# Patient Record
Sex: Male | Born: 1960 | Race: White | Hispanic: No | Marital: Married | Smoking: Never smoker
Health system: Southern US, Community
[De-identification: ages and names within clinical notes are randomized; demographics above are authoritative.]

## PROBLEM LIST (undated history)

## (undated) DIAGNOSIS — I10 Essential (primary) hypertension: Secondary | ICD-10-CM

## (undated) DIAGNOSIS — E785 Hyperlipidemia, unspecified: Secondary | ICD-10-CM

## (undated) DIAGNOSIS — K219 Gastro-esophageal reflux disease without esophagitis: Secondary | ICD-10-CM

## (undated) DIAGNOSIS — T7840XA Allergy, unspecified, initial encounter: Secondary | ICD-10-CM

## (undated) DIAGNOSIS — G4730 Sleep apnea, unspecified: Secondary | ICD-10-CM

## (undated) DIAGNOSIS — C801 Malignant (primary) neoplasm, unspecified: Secondary | ICD-10-CM

## (undated) HISTORY — DX: Hyperlipidemia, unspecified: E78.5

## (undated) HISTORY — PX: KNEE SURGERY: SHX244

## (undated) HISTORY — DX: Gastro-esophageal reflux disease without esophagitis: K21.9

## (undated) HISTORY — DX: Allergy, unspecified, initial encounter: T78.40XA

## (undated) HISTORY — DX: Sleep apnea, unspecified: G47.30

## (undated) HISTORY — DX: Essential (primary) hypertension: I10

---

## 2016-12-08 ENCOUNTER — Ambulatory Visit (INDEPENDENT_AMBULATORY_CARE_PROVIDER_SITE_OTHER): Payer: 59 | Admitting: Family Medicine

## 2016-12-08 ENCOUNTER — Encounter: Payer: Self-pay | Admitting: Family Medicine

## 2016-12-08 ENCOUNTER — Ambulatory Visit (INDEPENDENT_AMBULATORY_CARE_PROVIDER_SITE_OTHER)
Admission: RE | Admit: 2016-12-08 | Discharge: 2016-12-08 | Disposition: A | Discharge status: Home / Self Care | Payer: 59 | Source: Ambulatory Visit | Attending: Family Medicine | Admitting: Family Medicine

## 2016-12-08 VITALS — BP 134/86 | HR 73 | Temp 99.0°F | Ht 74.25 in | Wt 230.0 lb

## 2016-12-08 DIAGNOSIS — G8929 Other chronic pain: Secondary | ICD-10-CM | POA: Insufficient documentation

## 2016-12-08 DIAGNOSIS — M25562 Pain in left knee: Secondary | ICD-10-CM

## 2016-12-08 MED ORDER — MELOXICAM 15 MG PO TABS: 15.0000 mg | ORAL_TABLET | Freq: Every day | ORAL | 1 refills | Status: DC

## 2016-12-08 NOTE — Progress Notes (Signed)
  Sean [name] - 56 y.o. male MRN [id number]  Date of birth: 20-Nov-1960  SUBJECTIVE:  Including CC & ROS.  Chief Complaint  Patient presents with  . Knee Pain    left--fell down stairs tuesday and bend knee very far with causing great pain--knee has hurt for years.   Sean [name] is a 56 year old male is presenting with left knee pain. He reports having a fall last Tuesday and had significant pain. Over the course of the past few years he has had a decrease in his range of motion. He has a history of a meniscal tear in the 90s and had an arthroscopic surgery. Today he does not feel pain but is much as a stiffness. He is not taking any medications for this. He denies any locking way. He denies any brace use or use of a compression sleeve.     Review of Systems  Musculoskeletal: Positive for joint swelling. Negative for arthralgias and myalgias.  Neurological: Negative for weakness and numbness.   otherwise negative  HISTORY: Past Medical, Surgical, Social, and Family History Reviewed & Updated per EMR.   Pertinent Historical Findings include:  Past Medical History:  Diagnosis Date  . Hyperlipidemia   . Hypertension     Past Surgical History:  Procedure Laterality Date  . KNEE SURGERY      No Known Allergies  Family History  Problem Relation Age of Onset  . Prostate cancer Father      Social History   Social History  . Marital status: Married    Spouse name: N/A  . Number of children: N/A  . Years of education: N/A   Occupational History  . Not on file.   Social History Main Topics  . Smoking status: Never Smoker  . Smokeless tobacco: Never Used  . Alcohol use No  . Drug use: No  . Sexual activity: Not on file   Other Topics Concern  . Not on file   Social History Narrative  . No narrative on file     PHYSICAL EXAM:  VS: BP 134/86 (BP Location: Left Arm, Patient Position: Sitting, Cuff Size: Normal)   Pulse 73   Temp 99 F (37.2 C) (Oral)   Ht 6' 2.25"  (1.886 m)   Wt 230 lb (104.3 kg)   SpO2 99%   BMI 29.33 kg/m  Physical Exam Gen: NAD, alert, cooperative with exam, well-appearing ENT: normal lips, normal nasal mucosa,  Eye: PERRL, normal conjunctiva and lids CV:  no edema, +2 pedal pulses   Resp: no accessory muscle use, non-labored,   Skin: no rashes, no areas of induration  Neuro: Normal tone, normal sensation to touch Psych:  normal insight, alert and oriented MSK:  Left knee:  Significant effusion. No tenderness to palpation over the quadrant patellar tendon. No significant tenderness to palpation over the medial lateral joint line. Limited extension to about 5. Limited flexion to 110. Negative McMurray's test. Neurovascularly intact   ASSESSMENT & PLAN:   Chronic pain of left knee He has a significant effusion on ultrasound and findings are suggestive of arthritis. - Mobic sent - Advised ice and compressive therapy - If no improvement in follow-up in 2 weeks and can consider an aspiration and injection.

## 2016-12-08 NOTE — Patient Instructions (Signed)
Thank you for coming in,   Please try taking mobic on a daily basis for 10 days and then as needed after that. Please try wearing a compression sleeve when you're going to be walking around. You can obtain a compression sleeve from the [email or web address]. Please follow up with me if your symptoms worsen. We could try physical therapy in the future as well.    Please feel free to call with any questions or concerns at any time, at [phone]. --Dr. Raeford Razor

## 2016-12-08 NOTE — Assessment & Plan Note (Signed)
He has a significant effusion on ultrasound and findings are suggestive of arthritis. - Mobic sent - Advised ice and compressive therapy - If no improvement in follow-up in 2 weeks and can consider an aspiration and injection.

## 2016-12-25 ENCOUNTER — Ambulatory Visit (INDEPENDENT_AMBULATORY_CARE_PROVIDER_SITE_OTHER): Payer: 59 | Admitting: Family Medicine

## 2016-12-25 ENCOUNTER — Encounter: Payer: Self-pay | Admitting: Family Medicine

## 2016-12-25 VITALS — BP 135/90 | HR 80 | Ht 74.0 in | Wt 230.0 lb

## 2016-12-25 DIAGNOSIS — I10 Essential (primary) hypertension: Secondary | ICD-10-CM | POA: Diagnosis not present

## 2016-12-25 DIAGNOSIS — Z0000 Encounter for general adult medical examination without abnormal findings: Secondary | ICD-10-CM

## 2016-12-25 DIAGNOSIS — E785 Hyperlipidemia, unspecified: Secondary | ICD-10-CM

## 2016-12-25 DIAGNOSIS — D171 Benign lipomatous neoplasm of skin and subcutaneous tissue of trunk: Secondary | ICD-10-CM | POA: Diagnosis not present

## 2016-12-25 DIAGNOSIS — Z114 Encounter for screening for human immunodeficiency virus [HIV]: Secondary | ICD-10-CM

## 2016-12-25 DIAGNOSIS — Z1159 Encounter for screening for other viral diseases: Secondary | ICD-10-CM

## 2016-12-25 DIAGNOSIS — D179 Benign lipomatous neoplasm, unspecified: Secondary | ICD-10-CM | POA: Insufficient documentation

## 2016-12-25 LAB — CBC WITH DIFFERENTIAL/PLATELET
Basophils Absolute: 0 10*3/uL (ref 0.0–0.1)
Basophils Relative: 0.7 % (ref 0.0–3.0)
EOS ABS: 0.1 10*3/uL (ref 0.0–0.7)
EOS PCT: 1.2 % (ref 0.0–5.0)
HCT: 42 % (ref 39.0–52.0)
HEMOGLOBIN: 14 g/dL (ref 13.0–17.0)
LYMPHS ABS: 1.8 10*3/uL (ref 0.7–4.0)
Lymphocytes Relative: 30.1 % (ref 12.0–46.0)
MCHC: 33.4 g/dL (ref 30.0–36.0)
MCV: 89.9 fl (ref 78.0–100.0)
MONO ABS: 0.4 10*3/uL (ref 0.1–1.0)
Monocytes Relative: 6.6 % (ref 3.0–12.0)
NEUTROS PCT: 61.4 % (ref 43.0–77.0)
Neutro Abs: 3.6 10*3/uL (ref 1.4–7.7)
Platelets: 274 10*3/uL (ref 150.0–400.0)
RBC: 4.67 Mil/uL (ref 4.22–5.81)
RDW: 13.9 % (ref 11.5–15.5)
WBC: 5.8 10*3/uL (ref 4.0–10.5)

## 2016-12-25 LAB — COMPREHENSIVE METABOLIC PANEL
ALBUMIN: 3.9 g/dL (ref 3.5–5.2)
ALT: 17 U/L (ref 0–53)
AST: 13 U/L (ref 0–37)
Alkaline Phosphatase: 43 U/L (ref 39–117)
BUN: 18 mg/dL (ref 6–23)
CHLORIDE: 107 meq/L (ref 96–112)
CO2: 26 mEq/L (ref 19–32)
CREATININE: 0.8 mg/dL (ref 0.40–1.50)
Calcium: 9.2 mg/dL (ref 8.4–10.5)
GFR: 106.15 mL/min (ref >60.00)
GLUCOSE: 130 mg/dL — AB (ref 70–99)
POTASSIUM: 3.7 meq/L (ref 3.5–5.1)
SODIUM: 141 meq/L (ref 135–145)
TOTAL PROTEIN: 6.1 g/dL (ref 6.0–8.3)
Total Bilirubin: 0.4 mg/dL (ref 0.2–1.2)

## 2016-12-25 LAB — LIPID PANEL
CHOL/HDL RATIO: 5
Cholesterol: 151 mg/dL (ref 0–200)
HDL: 33.3 mg/dL — ABNORMAL LOW (ref >39.00)
NONHDL: 118.19
Triglycerides: 294 mg/dL — ABNORMAL HIGH (ref 0.0–149.0)
VLDL: 58.8 mg/dL — ABNORMAL HIGH (ref 0.0–40.0)

## 2016-12-25 LAB — HEMOGLOBIN A1C: Hgb A1c MFr Bld: 5.9 % (ref 4.6–6.5)

## 2016-12-25 LAB — LDL CHOLESTEROL, DIRECT: LDL DIRECT: 95 mg/dL

## 2016-12-25 MED ORDER — ATORVASTATIN CALCIUM 20 MG PO TABS: 20.0000 mg | ORAL_TABLET | Freq: Every day | ORAL | 1 refills | Status: DC

## 2016-12-25 MED ORDER — LISINOPRIL 10 MG PO TABS: 10.0000 mg | ORAL_TABLET | Freq: Every day | ORAL | 1 refills | Status: DC

## 2016-12-25 NOTE — Assessment & Plan Note (Signed)
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 Most likely benign given that it has not changed in size or other characteristic in several years. Hernia is also possible, though less likely. Offered to order an ultrasound today for further evaluation, however patient deferred. Will continue with watchful waiting.

## 2016-12-25 NOTE — Assessment & Plan Note (Addendum)
At goal. Continue lisinopril. Check CMET today. Follow up 6 months. Encouraged home blood pressure monitoring.

## 2016-12-25 NOTE — Patient Instructions (Signed)
We will check blood work today.  I sent in refills for your lisinopril and atorvastatin.  Come back to see me in 6 months to 1 year.  If you change your mind about your ultrasound, please let me know.  Take care,  Dr Jerline Pain

## 2016-12-25 NOTE — Assessment & Plan Note (Signed)
Check lipid panel.  Continue atorvastatin. 

## 2016-12-25 NOTE — Progress Notes (Signed)
Subjective:  Sean [name] is a 56 y.o. male who presents today for his annual comprehensive physical exam and to establish care  HPI:  Hypertension, Chronic Problem, New problem to this provider BP Readings from Last 3 Encounters:  12/25/16 135/90  12/08/16 134/86   Home BP monitoring: No, Current Medications: lisinopril 10mg  daily, compliant without side effects.  ROS: Denies any chest pain, shortness of breath, dyspnea on exertion, leg edema.   Hyperlipidemia, Chronic, New problem to this provider On statin: Atorvastatin 20mg  daily Side Effects: None  ROS: No chest pain or shortness of breath. No myalgias.  Abdominal Mass Noticed it several years ago. Located on right side of abdomen. Very seldomly painful. No fevers. Has not changed in several years. Does not bother patient. No nausea or vomiting. No fevers or chills. No constipation or diarrhea.   Lifestyle Diet: Tries to eat healthy, though admits there is room for improvement.  Exercise: Does not exercise.   Depression screen PHQ 2/9 12/08/2016  Decreased Interest 0  Down, Depressed, Hopeless 0  PHQ - 2 Score 0   Health Maintenance Due  Topic Date Due  . Hepatitis C Screening  08-10-1960  . HIV Screening  08/19/1975  . TETANUS/TDAP  08/19/1979  . INFLUENZA VACCINE  12/09/2016    ROS: Per HPI, otherwise all systems reviewed and are negative  PMH:  The following were reviewed and entered/updated in epic: Past Medical History:  Diagnosis Date  . Hyperlipidemia   . Hypertension    Patient Active Problem List   Diagnosis Date Noted  . Hypertension 12/25/2016  . Hyperlipidemia 12/25/2016  . Lipoma 12/25/2016  . Chronic pain of left knee 12/08/2016   Past Surgical History:  Procedure Laterality Date  . KNEE SURGERY     Family History  Problem Relation Age of Onset  . Prostate cancer Father    Medications- reviewed and updated Current Outpatient Prescriptions  Medication Sig Dispense Refill  .  atorvastatin (LIPITOR) 20 MG tablet Take 1 tablet (20 mg total) by mouth daily. 90 tablet 1  . lisinopril (PRINIVIL,ZESTRIL) 10 MG tablet Take 1 tablet (10 mg total) by mouth daily. 90 tablet 1  . meloxicam (MOBIC) 15 MG tablet Take 1 tablet (15 mg total) by mouth daily. 30 tablet 1   No current facility-administered medications for this visit.     Allergies-reviewed and updated No Known Allergies  Social History   Social History  . Marital status: Married    Spouse name: N/A  . Number of children: 3  . Years of education: N/A   Social History Main Topics  . Smoking status: Never Smoker  . Smokeless tobacco: Never Used  . Alcohol use No  . Drug use: No  . Sexual activity: Not Asked   Other Topics Concern  . None   Social History Narrative  . None   Objective:  Physical Exam: BP 135/90   Pulse 80   Ht 6\' 2"  (1.88 m)   Wt 230 lb (104.3 kg)   SpO2 97%   BMI 29.53 kg/m   Body mass index is 29.53 kg/m. Gen: NAD, resting comfortably CV: RRR with no murmurs appreciated Pulm: NWOB, CTAB with no crackles, wheezes, or rhonchi GI: Obese Normal bowel sounds present. Soft, Nontender, Nondistended. Approxmiately 3-4cm freely mobile mass in outer abdominal wall. Nontender.  MSK: no edema, cyanosis, or clubbing noted Skin: warm, dry Neuro: grossly normal, moves all extremities Psych: Normal affect and thought content   Assessment/Plan:  Hypertension At  goal. Continue lisinopril. Check CMET today. Follow up 6 months. Encouraged home blood pressure monitoring.   Hyperlipidemia Check lipid panel. Continue atorvastatin.   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 Most likely benign given that it has not changed in size or other characteristic in several years. Hernia is also possible, though less likely. Offered to order an ultrasound today for further evaluation, however patient deferred. Will continue with watchful waiting.   Preventative Healthcare HIV and HCV ordered  today.   Patient Counseling:  -Nutrition: Stressed importance of moderation in sodium/caffeine intake, saturated fat and cholesterol, caloric balance, sufficient intake of fresh fruits, vegetables, and fiber.  -Stressed the importance of regular exercise.   -Substance Abuse: Discussed cessation/primary prevention of tobacco, alcohol, or other drug use; driving or other dangerous activities under the influence; availability of treatment for abuse.   -Injury prevention: Discussed safety belts, safety helmets, smoke detector, smoking near bedding or upholstery.   -Sexuality: Discussed sexually transmitted diseases, partner selection, use of condoms, avoidance of unintended pregnancy and contraceptive alternatives.   -Dental health: Discussed importance of regular tooth brushing, flossing, and dental visits.  -Health maintenance and immunizations reviewed. Please refer to Health maintenance section.  Return to care in 1 year for next preventative visit.   Algis Greenhouse. Jerline Pain, MD 12/25/2016 4:13 PM

## 2016-12-26 LAB — HIV ANTIBODY (ROUTINE TESTING W REFLEX): HIV 1&2 Ab, 4th Generation: NONREACTIVE

## 2016-12-26 LAB — HEPATITIS C ANTIBODY: HCV Ab: NONREACTIVE

## 2017-06-04 ENCOUNTER — Encounter: Payer: Self-pay | Admitting: Family Medicine

## 2017-06-04 ENCOUNTER — Ambulatory Visit: Payer: 59 | Admitting: Family Medicine

## 2017-06-04 VITALS — BP 134/86 | HR 78 | Temp 98.7°F | Ht 74.0 in | Wt 224.0 lb

## 2017-06-04 DIAGNOSIS — R05 Cough: Secondary | ICD-10-CM | POA: Diagnosis not present

## 2017-06-04 DIAGNOSIS — I10 Essential (primary) hypertension: Secondary | ICD-10-CM | POA: Diagnosis not present

## 2017-06-04 DIAGNOSIS — R059 Cough, unspecified: Secondary | ICD-10-CM

## 2017-06-04 MED ORDER — AZITHROMYCIN 250 MG PO TABS: ORAL_TABLET | ORAL | 0 refills | Status: DC

## 2017-06-04 MED ORDER — IPRATROPIUM BROMIDE 0.06 % NA SOLN: 2.0000 | Freq: Four times a day (QID) | NASAL | 0 refills | Status: DC

## 2017-06-04 MED ORDER — GUAIFENESIN-CODEINE 100-10 MG/5ML PO SOLN: 5.0000 mL | Freq: Three times a day (TID) | ORAL | 0 refills | Status: DC | PRN

## 2017-06-04 NOTE — Assessment & Plan Note (Signed)
At goal. Continue lisinopril.  

## 2017-06-04 NOTE — Patient Instructions (Addendum)
Start the atrovent.  Start the cough syrup for your cough.  Start the zpack if your symptoms worsen or do not improve in a few days.  Please stay well hydrated.  You can take tylenol and/or motrin as needed for low grade fever and pain.  Please let me know if your symptoms worsen or fail to improve.  Please keep an eye on your blood pressure. Your goal is 140/90 or lower. If it is persistently above this, let me know.  Come back to see me in 6 months. We can make that your annual physical at that time if you would like.  Take care, Dr Jerline Pain

## 2017-06-04 NOTE — Progress Notes (Signed)
    Subjective:  Sean [name] is a 57 y.o. male who presents today for same-day appointment with a chief complaint of cough.   HPI:  Cough, Acute Issue Started 5 days ago. Worsened over that time. Associated with sore throat, rhinorrhea, fatigue, and malaise. No fevers or chills. Worse when laying at night and when laying down. Tried nyquil which did not help. Tried diabetic tussin and flonase which did not help. No sick contacts. No obvious alleviating or aggravating factors.  ROS: Per HPI  PMH: He reports that  has never smoked. he has never used smokeless tobacco. He reports that he does not drink alcohol or use drugs.  Objective:  Physical Exam: BP 134/86 (BP Location: Left Arm, Patient Position: Sitting, Cuff Size: Normal)   Pulse 78   Temp 98.7 F (37.1 C) (Oral)   Ht 6\' 2"  (1.88 m)   Wt 224 lb (101.6 kg)   SpO2 95%   BMI 28.76 kg/m   Gen: NAD, resting comfortably HEENT: TMs are clear effusion bilaterally.  Nasal mucosa erythematous with clear nasal discharge.  Maxillary sinuses with mildly decreased translation bilaterally.  Oropharynx erythematous without exudate.  No lymphadenopathy. CV: RRR with no murmurs appreciated Pulm: NWOB, CTAB with no crackles, wheezes, or rhonchi  Assessment/Plan:  Cough Likely secondary to viral URI. No signs of bacterial infection. Start atrovent for rhinorrhea/sinus congestion.  Start guaifenesin-codeine for cough. Sent in a "pocket prescription" for azithromycin with strict instruction to not start unless symptoms worse or fail to improve within the next several days. Recommended tylenol and/or motrin as needed for low grade fever and pain. Encouraged good oral hydration. Return precautions reviewed. Follow up as needed.   Hypertension At goal.  Continue lisinopril.  Algis Greenhouse. Jerline Pain, MD 06/04/2017 10:02 AM

## 2017-06-28 ENCOUNTER — Encounter: Payer: 59 | Admitting: Family Medicine

## 2017-07-15 ENCOUNTER — Other Ambulatory Visit: Payer: Self-pay | Admitting: Family Medicine

## 2017-07-19 ENCOUNTER — Other Ambulatory Visit: Payer: Self-pay | Admitting: Family Medicine

## 2017-12-02 ENCOUNTER — Encounter: Payer: 59 | Admitting: Family Medicine

## 2017-12-23 ENCOUNTER — Encounter: Payer: Self-pay | Admitting: Family Medicine

## 2017-12-23 ENCOUNTER — Ambulatory Visit (INDEPENDENT_AMBULATORY_CARE_PROVIDER_SITE_OTHER): Payer: 59 | Admitting: Family Medicine

## 2017-12-23 VITALS — BP 128/76 | HR 71 | Temp 97.8°F | Ht 74.0 in | Wt 224.8 lb

## 2017-12-23 DIAGNOSIS — N529 Male erectile dysfunction, unspecified: Secondary | ICD-10-CM

## 2017-12-23 DIAGNOSIS — Z0001 Encounter for general adult medical examination with abnormal findings: Secondary | ICD-10-CM

## 2017-12-23 DIAGNOSIS — M545 Low back pain, unspecified: Secondary | ICD-10-CM | POA: Insufficient documentation

## 2017-12-23 DIAGNOSIS — Z125 Encounter for screening for malignant neoplasm of prostate: Secondary | ICD-10-CM

## 2017-12-23 DIAGNOSIS — G8929 Other chronic pain: Secondary | ICD-10-CM | POA: Insufficient documentation

## 2017-12-23 DIAGNOSIS — I10 Essential (primary) hypertension: Secondary | ICD-10-CM

## 2017-12-23 DIAGNOSIS — R0982 Postnasal drip: Secondary | ICD-10-CM | POA: Diagnosis not present

## 2017-12-23 DIAGNOSIS — R739 Hyperglycemia, unspecified: Secondary | ICD-10-CM | POA: Diagnosis not present

## 2017-12-23 DIAGNOSIS — D171 Benign lipomatous neoplasm of skin and subcutaneous tissue of trunk: Secondary | ICD-10-CM

## 2017-12-23 DIAGNOSIS — E785 Hyperlipidemia, unspecified: Secondary | ICD-10-CM

## 2017-12-23 LAB — CBC
HCT: 43.4 % (ref 39.0–52.0)
HEMOGLOBIN: 14.6 g/dL (ref 13.0–17.0)
MCHC: 33.7 g/dL (ref 30.0–36.0)
MCV: 89.2 fl (ref 78.0–100.0)
PLATELETS: 297 10*3/uL (ref 150.0–400.0)
RBC: 4.86 Mil/uL (ref 4.22–5.81)
RDW: 13.6 % (ref 11.5–15.5)
WBC: 4.8 10*3/uL (ref 4.0–10.5)

## 2017-12-23 LAB — COMPREHENSIVE METABOLIC PANEL
ALBUMIN: 4.4 g/dL (ref 3.5–5.2)
ALK PHOS: 44 U/L (ref 39–117)
ALT: 20 U/L (ref 0–53)
AST: 15 U/L (ref 0–37)
BILIRUBIN TOTAL: 1 mg/dL (ref 0.2–1.2)
BUN: 17 mg/dL (ref 6–23)
CALCIUM: 9.8 mg/dL (ref 8.4–10.5)
CO2: 27 meq/L (ref 19–32)
CREATININE: 0.88 mg/dL (ref 0.40–1.50)
Chloride: 105 mEq/L (ref 96–112)
GFR: 94.75 mL/min (ref >60.00)
Glucose, Bld: 103 mg/dL — ABNORMAL HIGH (ref 70–99)
Potassium: 4.3 mEq/L (ref 3.5–5.1)
Sodium: 139 mEq/L (ref 135–145)
Total Protein: 7 g/dL (ref 6.0–8.3)

## 2017-12-23 LAB — HEMOGLOBIN A1C: HEMOGLOBIN A1C: 6 % (ref 4.6–6.5)

## 2017-12-23 LAB — LIPID PANEL
CHOL/HDL RATIO: 4
CHOLESTEROL: 164 mg/dL (ref 0–200)
HDL: 40.2 mg/dL (ref >39.00)
LDL Cholesterol: 99 mg/dL (ref 0–99)
NonHDL: 124.17
TRIGLYCERIDES: 126 mg/dL (ref 0.0–149.0)
VLDL: 25.2 mg/dL (ref 0.0–40.0)

## 2017-12-23 LAB — PSA: PSA: 2.44 ng/mL (ref 0.10–4.00)

## 2017-12-23 MED ORDER — SILDENAFIL CITRATE 20 MG PO TABS: 20.0000 mg | ORAL_TABLET | Freq: Every day | ORAL | 3 refills | Status: DC | PRN

## 2017-12-23 NOTE — Assessment & Plan Note (Signed)
Start sildenafil.  Discussed possible side effects of medication.

## 2017-12-23 NOTE — Assessment & Plan Note (Signed)
No red flag signs or symptoms.  Recommended over-the-counter antihistamine or intranasal steroid.

## 2017-12-23 NOTE — Assessment & Plan Note (Signed)
Check ultrasound for further evaluation.  Will likely need surgical referral depending on results.

## 2017-12-23 NOTE — Assessment & Plan Note (Signed)
Continue atorvastatin 20 mg daily.  Check lipid panel today.

## 2017-12-23 NOTE — Assessment & Plan Note (Signed)
Stable.  No red flag signs or symptoms.  Discussed home exercise program.  Recommended over-the-counter analgesics as needed.  Consider referral to PT/or sports medicine if no improvement.

## 2017-12-23 NOTE — Patient Instructions (Signed)
It was very nice to see you today!  Please try taking Claritin, Zyrtec, Xyzal, Allegra, or Flonase for your postnasal drip.  Please try this for a few weeks and let me know if no improvement.  We will try sildenafil as needed.  Please let me know if you have any side effects on this med  Please work on exercises for your back and let me know if your symptoms return or do not improve.  We will check an ultrasound of your abdomen and possibly send you to a surgeon as well.  We will check blood work today and let you know the results of their available.  Take care, Dr Jerline Pain   Preventive Care 40-64 Years, Male Preventive care refers to lifestyle choices and visits with your health care provider that can promote health and wellness. What does preventive care include?  A yearly physical exam. This is also called an annual well check.  Dental exams once or twice a year.  Routine eye exams. Ask your health care provider how often you should have your eyes checked.  Personal lifestyle choices, including: ? Daily care of your teeth and gums. ? Regular physical activity. ? Eating a healthy diet. ? Avoiding tobacco and drug use. ? Limiting alcohol use. ? Practicing safe sex. ? Taking low-dose aspirin every day starting at age 2. What happens during an annual well check? The services and screenings done by your health care provider during your annual well check will depend on your age, overall health, lifestyle risk factors, and family history of disease. Counseling Your health care provider may ask you questions about your:  Alcohol use.  Tobacco use.  Drug use.  Emotional well-being.  Home and relationship well-being.  Sexual activity.  Eating habits.  Work and work Statistician.  Screening You may have the following tests or measurements:  Height, weight, and BMI.  Blood pressure.  Lipid and cholesterol levels. These may be checked every 5 years, or more  frequently if you are over 14 years old.  Skin check.  Lung cancer screening. You may have this screening every year starting at age 74 if you have a 30-pack-year history of smoking and currently smoke or have quit within the past 15 years.  Fecal occult blood test (FOBT) of the stool. You may have this test every year starting at age 38.  Flexible sigmoidoscopy or colonoscopy. You may have a sigmoidoscopy every 5 years or a colonoscopy every 10 years starting at age 74.  Prostate cancer screening. Recommendations will vary depending on your family history and other risks.  Hepatitis C blood test.  Hepatitis B blood test.  Sexually transmitted disease (STD) testing.  Diabetes screening. This is done by checking your blood sugar (glucose) after you have not eaten for a while (fasting). You may have this done every 1-3 years.  Discuss your test results, treatment options, and if necessary, the need for more tests with your health care provider. Vaccines Your health care provider may recommend certain vaccines, such as:  Influenza vaccine. This is recommended every year.  Tetanus, diphtheria, and acellular pertussis (Tdap, Td) vaccine. You may need a Td booster every 10 years.  Varicella vaccine. You may need this if you have not been vaccinated.  Zoster vaccine. You may need this after age 61.  Measles, mumps, and rubella (MMR) vaccine. You may need at least one dose of MMR if you were born in 1957 or later. You may also need a second dose.  Pneumococcal 13-valent conjugate (PCV13) vaccine. You may need this if you have certain conditions and have not been vaccinated.  Pneumococcal polysaccharide (PPSV23) vaccine. You may need one or two doses if you smoke cigarettes or if you have certain conditions.  Meningococcal vaccine. You may need this if you have certain conditions.  Hepatitis A vaccine. You may need this if you have certain conditions or if you travel or work in places  where you may be exposed to hepatitis A.  Hepatitis B vaccine. You may need this if you have certain conditions or if you travel or work in places where you may be exposed to hepatitis B.  Haemophilus influenzae type b (Hib) vaccine. You may need this if you have certain risk factors.  Talk to your health care provider about which screenings and vaccines you need and how often you need them. This information is not intended to replace advice given to you by your health care provider. Make sure you discuss any questions you have with your health care provider. Document Released: 05/24/2015 Document Revised: 01/15/2016 Document Reviewed: 02/26/2015 Elsevier Interactive Patient Education  [employer].

## 2017-12-23 NOTE — Assessment & Plan Note (Signed)
At goal.  Continue lisinopril 10 mg daily.  Check CMET.  Check CBC.

## 2017-12-23 NOTE — Progress Notes (Signed)
Subjective:  Sean [name] is a 57 y.o. male who presents today for his annual comprehensive physical exam.    HPI:  He has no acute complaints today.   His chronic, stable conditions are outlined below:  1. Abdominal Mass. Several year history. Located in right lower abdomen. Occasionally painful. 2. Erectile Dysfunction. Several year history. Has not been on any medications in the past. No premature ejaculation.  3. Post nasal drip. Several month history. No treatments tried. Occasional cough.  4. Chronic Low Back Pain. Several year history. Occasional radiation down legs.   Lifestyle Diet: Trying eat healthy. No specific diets.  Exercise: No specific exercises.   Depression screen PHQ 2/9 12/23/2017  Decreased Interest 0  Down, Depressed, Hopeless 0  PHQ - 2 Score 0    Health Maintenance Due  Topic Date Due  . TETANUS/TDAP  08/19/1979  . INFLUENZA VACCINE  12/09/2017     ROS: Per HPI, otherwise a complete review of systems was negative.   PMH:  The following were reviewed and entered/updated in epic: Past Medical History:  Diagnosis Date  . Hyperlipidemia   . Hypertension    Patient Active Problem List   Diagnosis Date Noted  . Erectile dysfunction 12/23/2017  . Postnasal drip 12/23/2017  . Chronic right-sided low back pain without sciatica 12/23/2017  . Hypertension 12/25/2016  . Hyperlipidemia 12/25/2016  . Lipoma 12/25/2016  . Chronic pain of left knee 12/08/2016   Past Surgical History:  Procedure Laterality Date  . KNEE SURGERY      Family History  Problem Relation Age of Onset  . Prostate cancer Father   . Prostate cancer Paternal Uncle        diagnosed in 2007    Medications- reviewed and updated Current Outpatient Medications  Medication Sig Dispense Refill  . atorvastatin (LIPITOR) 20 MG tablet TAKE 1 TABLET BY MOUTH EVERY DAY 90 tablet 1  . lisinopril (PRINIVIL,ZESTRIL) 10 MG tablet TAKE 1 TABLET BY MOUTH EVERY DAY 90 tablet 1  .  sildenafil (REVATIO) 20 MG tablet Take 1-5 tablets (20-100 mg total) by mouth daily as needed (erectile dysfunction). 90 tablet 3   No current facility-administered medications for this visit.     Allergies-reviewed and updated No Known Allergies  Social History   Socioeconomic History  . Marital status: Married    Spouse name: Not on file  . Number of children: 3  . Years of education: Not on file  . Highest education level: Not on file  Occupational History  . Not on file  Social Needs  . Financial resource strain: Not on file  . Food insecurity:    Worry: Not on file    Inability: Not on file  . Transportation needs:    Medical: Not on file    Non-medical: Not on file  Tobacco Use  . Smoking status: Never Smoker  . Smokeless tobacco: Never Used  Substance and Sexual Activity  . Alcohol use: No  . Drug use: No  . Sexual activity: Not on file  Lifestyle  . Physical activity:    Days per week: Not on file    Minutes per session: Not on file  . Stress: Not on file  Relationships  . Social connections:    Talks on phone: Not on file    Gets together: Not on file    Attends religious service: Not on file    Active member of club or organization: Not on file    Attends meetings  of clubs or organizations: Not on file    Relationship status: Not on file  Other Topics Concern  . Not on file  Social History Narrative  . Not on file    Objective:  Physical Exam: BP 128/76 (BP Location: Left Arm, Patient Position: Sitting, Cuff Size: Normal)   Pulse 71   Temp 97.8 F (36.6 C) (Oral)   Ht 6\' 2"  (1.88 m)   Wt 224 lb 12.8 oz (102 kg)   SpO2 96%   BMI 28.86 kg/m   Body mass index is 28.86 kg/m. Wt Readings from Last 3 Encounters:  12/23/17 224 lb 12.8 oz (102 kg)  06/04/17 224 lb (101.6 kg)  12/25/16 230 lb (104.3 kg)   Gen: NAD, resting comfortably HEENT: TMs normal bilaterally. OP clear. No thyromegaly noted. Nasal mucosa with small amount of clear  discharge. CV: RRR with no murmurs appreciated Pulm: NWOB, CTAB with no crackles, wheezes, or rhonchi GI: Normal bowel sounds present. Soft, Nontender, Nondistended. MSK: no edema, cyanosis, or clubbing noted Skin: warm, dry Neuro: CN2-12 grossly intact. Strength 5/5 in upper and lower extremities. Reflexes symmetric and intact bilaterally.  Psych: Normal affect and thought content  Assessment/Plan:  Postnasal drip No red flag signs or symptoms.  Recommended over-the-counter antihistamine or intranasal steroid.  Lipoma Check ultrasound for further evaluation.  Will likely need surgical referral depending on results.  Hyperlipidemia Continue atorvastatin 20 mg daily.  Check lipid panel today.  Hypertension At goal.  Continue lisinopril 10 mg daily.  Check CMET.  Check CBC.  Erectile dysfunction Start sildenafil.  Discussed possible side effects of medication.  Chronic right-sided low back pain without sciatica Stable.  No red flag signs or symptoms.  Discussed home exercise program.  Recommended over-the-counter analgesics as needed.  Consider referral to PT/or sports medicine if no improvement.  Preventative Healthcare: Check PSA.  Patient Counseling(The following topics were reviewed and/or handout was given):  -Nutrition: Stressed importance of moderation in sodium/caffeine intake, saturated fat and cholesterol, caloric balance, sufficient intake of fresh fruits, vegetables, and fiber.  -Stressed the importance of regular exercise.   -Substance Abuse: Discussed cessation/primary prevention of tobacco, alcohol, or other drug use; driving or other dangerous activities under the influence; availability of treatment for abuse.   -Injury prevention: Discussed safety belts, safety helmets, smoke detector, smoking near bedding or upholstery.   -Sexuality: Discussed sexually transmitted diseases, partner selection, use of condoms, avoidance of unintended pregnancy and contraceptive  alternatives.   -Dental health: Discussed importance of regular tooth brushing, flossing, and dental visits.  -Health maintenance and immunizations reviewed. Please refer to Health maintenance section.  Return to care in 1 year for next preventative visit.   Algis Greenhouse. Jerline Pain, MD 12/23/2017 9:36 AM

## 2017-12-24 ENCOUNTER — Encounter: Payer: Self-pay | Admitting: Family Medicine

## 2017-12-24 DIAGNOSIS — R739 Hyperglycemia, unspecified: Secondary | ICD-10-CM | POA: Insufficient documentation

## 2017-12-24 NOTE — Progress Notes (Signed)
Please inform patient of the following:  Blood counts are normal.  Electrolytes, kidney function, and liver function are normal. Cholesterol levels are normal. His blood sugar is mildly elevated. Do not need to start any medications for this but patient should work on diet and exercise and we can recheck next year.   Sean [name]. Jerline Pain, MD 12/24/2017 8:05 AM

## 2018-01-11 ENCOUNTER — Other Ambulatory Visit: Payer: Self-pay | Admitting: Family Medicine

## 2018-01-13 ENCOUNTER — Ambulatory Visit
Admission: RE | Admit: 2018-01-13 | Discharge: 2018-01-13 | Disposition: A | Discharge status: Home / Self Care | Payer: 59 | Source: Ambulatory Visit | Attending: Family Medicine | Admitting: Family Medicine

## 2018-01-13 DIAGNOSIS — D171 Benign lipomatous neoplasm of skin and subcutaneous tissue of trunk: Secondary | ICD-10-CM

## 2018-01-13 DIAGNOSIS — K439 Ventral hernia without obstruction or gangrene: Secondary | ICD-10-CM | POA: Diagnosis not present

## 2018-01-14 NOTE — Progress Notes (Signed)
Please inform patient of the following:  His ultrasound was showed that he has a hernia. Would like for him to see a surgeon for further evaluation if he is interested.  Algis Greenhouse. Jerline Pain, MD 01/14/2018 5:02 PM

## 2018-01-17 ENCOUNTER — Other Ambulatory Visit: Payer: Self-pay

## 2018-01-17 DIAGNOSIS — K439 Ventral hernia without obstruction or gangrene: Secondary | ICD-10-CM

## 2018-01-31 ENCOUNTER — Ambulatory Visit: Payer: Self-pay | Admitting: Surgery

## 2018-01-31 DIAGNOSIS — K439 Ventral hernia without obstruction or gangrene: Secondary | ICD-10-CM | POA: Diagnosis not present

## 2018-01-31 NOTE — H&P (Signed)
Lidia Collum Documented: 01/31/2018 11:45 AM Location: [facility] Patient #: [id number] DOB: 10/27/1960 Married / Language: Cleophus Molt / Race: White Male  History of Present Illness Marcello Moores A. Shadai Mcclane MD; 01/31/2018 12:20 PM) Patient words: Patient is sent at the request of Dr. Jerline Pain for a bulge in the patient's epigastrium over his abdomen. Skin present for more than one year. It causes mild to moderate discomfort especially when coughing or sneezing. The bulge is discharged to the right umbilicus and just superior to that. It is full and gets larger with standing, coughing or straining. He has no change to his bowel or bladder function. An ultrasound was done which shows a ventral hernia at that site.  The patient is a 57 year old male.   Past Surgical History Sabino Gasser; 01/31/2018 11:45 AM) Knee Surgery Left.  Diagnostic Studies History Sabino Gasser; 01/31/2018 11:45 AM) Colonoscopy 1-5 years ago  Allergies Sabino Gasser; 01/31/2018 11:46 AM) No Known Drug Allergies [01/31/2018]: Allergies Reconciled  Medication History Sabino Gasser; 01/31/2018 11:46 AM) Atorvastatin Calcium (20MG  Tablet, Oral) Active. Lisinopril (10MG  Tablet, Oral) Active. Sildenafil Citrate (20MG  Tablet, Oral) Active. Medications Reconciled  Social History Sabino Gasser; 01/31/2018 11:45 AM) Alcohol use Occasional alcohol use. Caffeine use Coffee. No drug use Tobacco use Never smoker.  Family History Sabino Gasser; 01/31/2018 11:45 AM) Colon Cancer Father. Ovarian Cancer Mother.  Other Problems Sabino Gasser; 01/31/2018 11:45 AM) Back Pain Chest pain Diabetes Mellitus Enlarged Prostate High blood pressure Pancreatitis Umbilical Hernia Repair     Review of Systems Sabino Gasser; 01/31/2018 11:45 AM) General Not Present- Appetite Loss, Chills, Fatigue, Fever, Night Sweats, Weight Gain and Weight Loss. Skin Not Present- Change in Wart/Mole, Dryness, Hives,  Jaundice, New Lesions, Non-Healing Wounds, Rash and Ulcer. HEENT Present- Seasonal Allergies. Not Present- Earache, Hearing Loss, Hoarseness, Nose Bleed, Oral Ulcers, Ringing in the Ears, Sinus Pain, Sore Throat, Visual Disturbances, Wears glasses/contact lenses and Yellow Eyes. Respiratory Not Present- Bloody sputum, Chronic Cough, Difficulty Breathing, Snoring and Wheezing. Breast Not Present- Breast Mass, Breast Pain, Nipple Discharge and Skin Changes. Cardiovascular Not Present- Chest Pain, Difficulty Breathing Lying Down, Leg Cramps, Palpitations, Rapid Heart Rate, Shortness of Breath and Swelling of Extremities. Gastrointestinal Not Present- Abdominal Pain, Bloating, Bloody Stool, Change in Bowel Habits, Chronic diarrhea, Constipation, Difficulty Swallowing, Excessive gas, Gets full quickly at meals, Hemorrhoids, Indigestion, Nausea, Rectal Pain and Vomiting. Male Genitourinary Not Present- Blood in Urine, Change in Urinary Stream, Frequency, Impotence, Nocturia, Painful Urination, Urgency and Urine Leakage. Musculoskeletal Not Present- Back Pain, Joint Pain, Joint Stiffness, Muscle Pain, Muscle Weakness and Swelling of Extremities. Neurological Not Present- Decreased Memory, Fainting, Headaches, Numbness, Seizures, Tingling, Tremor, Trouble walking and Weakness. Psychiatric Not Present- Anxiety, Bipolar, Change in Sleep Pattern, Depression, Fearful and Frequent crying. Endocrine Not Present- Cold Intolerance, Excessive Hunger, Hair Changes, Heat Intolerance, Hot flashes and New Diabetes. Hematology Not Present- Blood Thinners, Easy Bruising, Excessive bleeding, Gland problems, HIV and Persistent Infections.  Vitals Sabino Gasser; 01/31/2018 11:47 AM) 01/31/2018 11:46 AM Weight: 227.25 lb Height: 75in Body Surface Area: 2.32 m Body Mass Index: 28.4 kg/m  Temp.: 98.22F(Oral)  Pulse: 89 (Regular)  BP: 128/80 (Sitting, Left Arm, Standard)      Physical Exam (Semaj Kham A. Abygayle Deltoro  MD; 01/31/2018 12:21 PM)  General Mental Status-Alert. General Appearance-Consistent with stated age. Hydration-Well hydrated. Voice-Normal.  Head and Neck Head-normocephalic, atraumatic with no lesions or palpable masses. Trachea-midline. Thyroid Gland Characteristics - normal size and consistency.  Eye Eyeball - Bilateral-Extraocular movements intact. Sclera/Conjunctiva -  Bilateral-No scleral icterus.  Chest and Lung Exam Chest and lung exam reveals -quiet, even and easy respiratory effort with no use of accessory muscles and on auscultation, normal breath sounds, no adventitious sounds and normal vocal resonance. Inspection Chest Wall - Normal. Back - normal.  Cardiovascular Cardiovascular examination reveals -normal heart sounds, regular rate and rhythm with no murmurs and normal pedal pulses bilaterally.  Abdomen Note: bulge partally reducible nontender   Neurologic Neurologic evaluation reveals -alert and oriented x 3 with no impairment of recent or remote memory. Mental Status-Normal.  Musculoskeletal Normal Exam - Left-Upper Extremity Strength Normal and Lower Extremity Strength Normal. Normal Exam - Right-Upper Extremity Strength Normal and Lower Extremity Strength Normal.    Assessment & Plan (Lurlie Wigen A. 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 Risks, benefits and long-term expectations of each operation discussed. Discussed nonoperative management. The patient has opted for open repair of his ventral hernia with mesh. The risk of hernia repair include bleeding, infection, organ injury, bowel injury, bladder injury, nerve injury recurrent hernia, blood clots, worsening of underlying condition, chronic pain, mesh use, open surgery, death, and the need for other operattions. Pt agrees to proceed  Current  Plans Pt Education - Pamphlet Given - Hernia Surgery: discussed with patient and provided information. The anatomy & physiology of the abdominal wall was discussed. The pathophysiology of hernias was discussed. Natural history risks without surgery including progeressive enlargement, pain, incarceration, & strangulation was discussed. Contributors to complications such as smoking, obesity, diabetes, prior surgery, etc were discussed.  I feel the risks of no intervention will lead to serious problems that outweigh the operative risks; therefore, I recommended surgery to reduce and repair the hernia. I explained laparoscopic techniques with open approach. I noted the probable use of mesh to patch and/or buttress the hernia repair  Risks such as bleeding, infection, abscess, need for further treatment, heart attack, death, and other risks were discussed. I noted a good likelihood this will help address the problem. Goals of post-operative recovery were discussed as well. Possibility that this will not correct all symptoms was explained. I stressed the importance of low-impact activity, aggressive pain control, avoiding constipation, & not pushing through pain to minimize risk of post-operative chronic pain or injury. Possibility of reherniation especially with smoking, obesity, diabetes, immunosuppression, and other health conditions was discussed. We will work to minimize complications.  An educational handout further explaining the pathology & treatment options was given as well. Questions were answered. The patient expresses understanding & wishes to proceed with surgery.  Pt Education - CCS Mesh education: discussed with patient and provided information.

## 2018-03-09 ENCOUNTER — Other Ambulatory Visit: Payer: Self-pay

## 2018-03-09 ENCOUNTER — Encounter (HOSPITAL_BASED_OUTPATIENT_CLINIC_OR_DEPARTMENT_OTHER): Payer: Self-pay | Admitting: *Deleted

## 2018-03-09 NOTE — Progress Notes (Signed)
Bring all medications. Coming tomorrow or Friday for EKG and to pick up Ensure.

## 2018-03-11 ENCOUNTER — Encounter (HOSPITAL_BASED_OUTPATIENT_CLINIC_OR_DEPARTMENT_OTHER)
Admission: RE | Admit: 2018-03-11 | Discharge: 2018-03-11 | Disposition: A | Discharge status: Home / Self Care | Payer: 59 | Source: Ambulatory Visit | Attending: Surgery | Admitting: Surgery

## 2018-03-11 DIAGNOSIS — Z01810 Encounter for preprocedural cardiovascular examination: Secondary | ICD-10-CM | POA: Insufficient documentation

## 2018-03-11 NOTE — Progress Notes (Signed)
Ensure pre surgery drink given with instructions to complete by 1000 dos, pt verbalized understanding. 

## 2018-03-17 ENCOUNTER — Ambulatory Visit (HOSPITAL_BASED_OUTPATIENT_CLINIC_OR_DEPARTMENT_OTHER)
Admission: RE | Admit: 2018-03-17 | Discharge: 2018-03-17 | Disposition: A | Discharge status: Home / Self Care | Payer: 59 | Source: Ambulatory Visit | Attending: Surgery | Admitting: Surgery

## 2018-03-17 ENCOUNTER — Ambulatory Visit (HOSPITAL_BASED_OUTPATIENT_CLINIC_OR_DEPARTMENT_OTHER): Payer: 59 | Admitting: Anesthesiology

## 2018-03-17 ENCOUNTER — Encounter (HOSPITAL_BASED_OUTPATIENT_CLINIC_OR_DEPARTMENT_OTHER)
Admission: RE | Disposition: A | Discharge status: Home / Self Care | Payer: Self-pay | Source: Ambulatory Visit | Attending: Surgery

## 2018-03-17 ENCOUNTER — Other Ambulatory Visit: Payer: Self-pay

## 2018-03-17 ENCOUNTER — Encounter (HOSPITAL_BASED_OUTPATIENT_CLINIC_OR_DEPARTMENT_OTHER): Payer: Self-pay

## 2018-03-17 DIAGNOSIS — R079 Chest pain, unspecified: Secondary | ICD-10-CM | POA: Diagnosis not present

## 2018-03-17 DIAGNOSIS — Z8041 Family history of malignant neoplasm of ovary: Secondary | ICD-10-CM | POA: Diagnosis not present

## 2018-03-17 DIAGNOSIS — K439 Ventral hernia without obstruction or gangrene: Secondary | ICD-10-CM | POA: Insufficient documentation

## 2018-03-17 DIAGNOSIS — I10 Essential (primary) hypertension: Secondary | ICD-10-CM | POA: Diagnosis not present

## 2018-03-17 DIAGNOSIS — K859 Acute pancreatitis without necrosis or infection, unspecified: Secondary | ICD-10-CM | POA: Diagnosis not present

## 2018-03-17 DIAGNOSIS — E119 Type 2 diabetes mellitus without complications: Secondary | ICD-10-CM | POA: Insufficient documentation

## 2018-03-17 DIAGNOSIS — M549 Dorsalgia, unspecified: Secondary | ICD-10-CM | POA: Insufficient documentation

## 2018-03-17 DIAGNOSIS — G8918 Other acute postprocedural pain: Secondary | ICD-10-CM | POA: Diagnosis not present

## 2018-03-17 DIAGNOSIS — Z79899 Other long term (current) drug therapy: Secondary | ICD-10-CM | POA: Diagnosis not present

## 2018-03-17 DIAGNOSIS — Z800 Family history of malignant neoplasm of digestive organs: Secondary | ICD-10-CM | POA: Insufficient documentation

## 2018-03-17 DIAGNOSIS — N400 Enlarged prostate without lower urinary tract symptoms: Secondary | ICD-10-CM | POA: Diagnosis not present

## 2018-03-17 HISTORY — PX: VENTRAL HERNIA REPAIR: SHX424

## 2018-03-17 HISTORY — PX: INSERTION OF MESH: SHX5868

## 2018-03-17 SURGERY — REPAIR, HERNIA, VENTRAL
Anesthesia: Regional | Site: Abdomen

## 2018-03-17 MED ORDER — ACETAMINOPHEN 500 MG PO TABS
ORAL_TABLET | ORAL | Status: AC
  Filled 2018-03-17: qty 2

## 2018-03-17 MED ORDER — GABAPENTIN 300 MG PO CAPS
ORAL_CAPSULE | ORAL | Status: AC
  Filled 2018-03-17: qty 1

## 2018-03-17 MED ORDER — ROCURONIUM BROMIDE 50 MG/5ML IV SOSY
PREFILLED_SYRINGE | INTRAVENOUS | Status: AC
  Filled 2018-03-17: qty 5

## 2018-03-17 MED ORDER — ACETAMINOPHEN 10 MG/ML IV SOLN
INTRAVENOUS | Status: AC
  Filled 2018-03-17: qty 100

## 2018-03-17 MED ORDER — ACETAMINOPHEN 500 MG PO TABS
1000.0000 mg | ORAL_TABLET | ORAL | Status: AC
  Administered 2018-03-17: 1000 mg via ORAL

## 2018-03-17 MED ORDER — MIDAZOLAM HCL 2 MG/2ML IJ SOLN
INTRAMUSCULAR | Status: AC
  Filled 2018-03-17: qty 2

## 2018-03-17 MED ORDER — OXYCODONE HCL 5 MG PO TABS
5.0000 mg | ORAL_TABLET | Freq: Once | ORAL | Status: AC
  Administered 2018-03-17: 5 mg via ORAL

## 2018-03-17 MED ORDER — GABAPENTIN 300 MG PO CAPS
300.0000 mg | ORAL_CAPSULE | ORAL | Status: AC
  Administered 2018-03-17: 300 mg via ORAL

## 2018-03-17 MED ORDER — LACTATED RINGERS IV SOLN
INTRAVENOUS | Status: DC
  Administered 2018-03-17: 11:00:00 via INTRAVENOUS

## 2018-03-17 MED ORDER — CEFAZOLIN SODIUM-DEXTROSE 2-4 GM/100ML-% IV SOLN
2.0000 g | INTRAVENOUS | Status: AC
  Administered 2018-03-17: 2 g via INTRAVENOUS

## 2018-03-17 MED ORDER — DEXAMETHASONE SODIUM PHOSPHATE 10 MG/ML IJ SOLN
INTRAMUSCULAR | Status: AC
  Filled 2018-03-17: qty 1

## 2018-03-17 MED ORDER — DEXAMETHASONE SODIUM PHOSPHATE 4 MG/ML IJ SOLN
INTRAMUSCULAR | Status: DC | PRN
  Administered 2018-03-17: 10 mg via INTRAVENOUS

## 2018-03-17 MED ORDER — CELECOXIB 200 MG PO CAPS
ORAL_CAPSULE | ORAL | Status: AC
  Filled 2018-03-17: qty 2

## 2018-03-17 MED ORDER — ONDANSETRON HCL 4 MG/2ML IJ SOLN
INTRAMUSCULAR | Status: AC
  Filled 2018-03-17: qty 2

## 2018-03-17 MED ORDER — LIDOCAINE HCL (CARDIAC) PF 100 MG/5ML IV SOSY
PREFILLED_SYRINGE | INTRAVENOUS | Status: DC | PRN
  Administered 2018-03-17: 80 mg via INTRAVENOUS

## 2018-03-17 MED ORDER — FENTANYL CITRATE (PF) 100 MCG/2ML IJ SOLN
50.0000 ug | INTRAMUSCULAR | Status: AC | PRN
  Administered 2018-03-17 (×3): 50 ug via INTRAVENOUS

## 2018-03-17 MED ORDER — SCOPOLAMINE 1 MG/3DAYS TD PT72: 1.0000 | MEDICATED_PATCH | Freq: Once | TRANSDERMAL | Status: DC | PRN

## 2018-03-17 MED ORDER — FENTANYL CITRATE (PF) 100 MCG/2ML IJ SOLN
INTRAMUSCULAR | Status: AC
  Filled 2018-03-17: qty 2

## 2018-03-17 MED ORDER — ONDANSETRON HCL 4 MG/2ML IJ SOLN
INTRAMUSCULAR | Status: DC | PRN
  Administered 2018-03-17: 4 mg via INTRAVENOUS

## 2018-03-17 MED ORDER — BUPIVACAINE-EPINEPHRINE (PF) 0.25% -1:200000 IJ SOLN
INTRAMUSCULAR | Status: DC | PRN
  Administered 2018-03-17: 18 mL

## 2018-03-17 MED ORDER — OXYCODONE HCL 5 MG PO TABS
ORAL_TABLET | ORAL | Status: AC
  Filled 2018-03-17: qty 1

## 2018-03-17 MED ORDER — CEFAZOLIN SODIUM-DEXTROSE 2-4 GM/100ML-% IV SOLN
INTRAVENOUS | Status: AC
  Filled 2018-03-17: qty 100

## 2018-03-17 MED ORDER — LIDOCAINE 2% (20 MG/ML) 5 ML SYRINGE
INTRAMUSCULAR | Status: AC
  Filled 2018-03-17: qty 5

## 2018-03-17 MED ORDER — CHLORHEXIDINE GLUCONATE CLOTH 2 % EX PADS: 6.0000 | MEDICATED_PAD | Freq: Once | CUTANEOUS | Status: DC

## 2018-03-17 MED ORDER — CELECOXIB 400 MG PO CAPS
400.0000 mg | ORAL_CAPSULE | ORAL | Status: AC
  Administered 2018-03-17: 400 mg via ORAL

## 2018-03-17 MED ORDER — ROPIVACAINE HCL 5 MG/ML IJ SOLN
INTRAMUSCULAR | Status: DC | PRN
  Administered 2018-03-17 (×2): 30 mL via PERINEURAL

## 2018-03-17 MED ORDER — FENTANYL CITRATE (PF) 100 MCG/2ML IJ SOLN
25.0000 ug | INTRAMUSCULAR | Status: DC | PRN
  Administered 2018-03-17: 25 ug via INTRAVENOUS

## 2018-03-17 MED ORDER — PROPOFOL 10 MG/ML IV BOLUS
INTRAVENOUS | Status: DC | PRN
  Administered 2018-03-17: 150 mg via INTRAVENOUS

## 2018-03-17 MED ORDER — MIDAZOLAM HCL 2 MG/2ML IJ SOLN
1.0000 mg | INTRAMUSCULAR | Status: DC | PRN
  Administered 2018-03-17 (×2): 1 mg via INTRAVENOUS

## 2018-03-17 MED ORDER — ROCURONIUM BROMIDE 100 MG/10ML IV SOLN
INTRAVENOUS | Status: DC | PRN
  Administered 2018-03-17: 50 mg via INTRAVENOUS

## 2018-03-17 MED ORDER — SUGAMMADEX SODIUM 500 MG/5ML IV SOLN
INTRAVENOUS | Status: AC
  Filled 2018-03-17: qty 5

## 2018-03-17 MED ORDER — SUGAMMADEX SODIUM 500 MG/5ML IV SOLN
INTRAVENOUS | Status: DC | PRN
  Administered 2018-03-17: 400 mg via INTRAVENOUS

## 2018-03-17 MED ORDER — OXYCODONE HCL 5 MG PO TABS: 5.0000 mg | ORAL_TABLET | Freq: Four times a day (QID) | ORAL | 0 refills | Status: DC | PRN

## 2018-03-17 MED ORDER — IBUPROFEN 800 MG PO TABS: 800.0000 mg | ORAL_TABLET | Freq: Three times a day (TID) | ORAL | 0 refills | Status: DC | PRN

## 2018-03-17 SURGICAL SUPPLY — 55 items
BENZOIN TINCTURE PRP APPL 2/3 (GAUZE/BANDAGES/DRESSINGS) IMPLANT
BLADE CLIPPER SURG (BLADE) ×3 IMPLANT
BLADE SURG 10 STRL SS (BLADE) ×3 IMPLANT
BLADE SURG 15 STRL LF DISP TIS (BLADE) ×1 IMPLANT
BLADE SURG 15 STRL SS (BLADE) ×2
CANISTER SUCT 1200ML W/VALVE (MISCELLANEOUS) IMPLANT
CHLORAPREP W/TINT 26ML (MISCELLANEOUS) ×3 IMPLANT
CLEANER CAUTERY TIP 5X5 PAD (MISCELLANEOUS) ×1 IMPLANT
CLOSURE WOUND 1/2 X4 (GAUZE/BANDAGES/DRESSINGS)
COVER BACK TABLE 60X90IN (DRAPES) ×3 IMPLANT
COVER MAYO STAND STRL (DRAPES) ×3 IMPLANT
COVER WAND RF STERILE (DRAPES) IMPLANT
DECANTER SPIKE VIAL GLASS SM (MISCELLANEOUS) IMPLANT
DERMABOND ADVANCED (GAUZE/BANDAGES/DRESSINGS) ×2
DERMABOND ADVANCED .7 DNX12 (GAUZE/BANDAGES/DRESSINGS) ×1 IMPLANT
DRAPE LAPAROTOMY TRNSV 102X78 (DRAPE) ×3 IMPLANT
DRAPE UTILITY XL STRL (DRAPES) ×3 IMPLANT
DRSG TEGADERM 4X4.75 (GAUZE/BANDAGES/DRESSINGS) IMPLANT
ELECT REM PT RETURN 9FT ADLT (ELECTROSURGICAL) ×3
ELECTRODE REM PT RTRN 9FT ADLT (ELECTROSURGICAL) ×1 IMPLANT
GLOVE BIO SURGEON STRL SZ 6.5 (GLOVE) ×2 IMPLANT
GLOVE BIO SURGEONS STRL SZ 6.5 (GLOVE) ×1
GLOVE BIOGEL PI IND STRL 7.0 (GLOVE) ×1 IMPLANT
GLOVE BIOGEL PI IND STRL 8 (GLOVE) ×1 IMPLANT
GLOVE BIOGEL PI INDICATOR 7.0 (GLOVE) ×2
GLOVE BIOGEL PI INDICATOR 8 (GLOVE) ×2
GLOVE ECLIPSE 8.0 STRL XLNG CF (GLOVE) ×3 IMPLANT
GOWN STRL REUS W/ TWL LRG LVL3 (GOWN DISPOSABLE) ×2 IMPLANT
GOWN STRL REUS W/TWL LRG LVL3 (GOWN DISPOSABLE) ×4
MESH VENTRALEX ST 2.5 CRC MED (Mesh General) ×3 IMPLANT
NEEDLE HYPO 25X1 1.5 SAFETY (NEEDLE) ×3 IMPLANT
NS IRRIG 1000ML POUR BTL (IV SOLUTION) ×3 IMPLANT
PACK BASIN DAY SURGERY FS (CUSTOM PROCEDURE TRAY) ×3 IMPLANT
PAD CLEANER CAUTERY TIP 5X5 (MISCELLANEOUS) ×2
PENCIL BUTTON HOLSTER BLD 10FT (ELECTRODE) ×3 IMPLANT
SLEEVE SCD COMPRESS KNEE MED (MISCELLANEOUS) ×3 IMPLANT
SPONGE LAP 4X18 RFD (DISPOSABLE) ×3 IMPLANT
STAPLER VISISTAT 35W (STAPLE) IMPLANT
STRIP CLOSURE SKIN 1/2X4 (GAUZE/BANDAGES/DRESSINGS) IMPLANT
SUT MON AB 4-0 PC3 18 (SUTURE) ×3 IMPLANT
SUT NOVA NAB DX-16 0-1 5-0 T12 (SUTURE) ×6 IMPLANT
SUT NOVA NAB GS-22 2 0 T19 (SUTURE) IMPLANT
SUT PDS AB 1 CT  36 (SUTURE)
SUT PDS AB 1 CT 36 (SUTURE) IMPLANT
SUT SILK 3 0 SH 30 (SUTURE) IMPLANT
SUT VIC AB 2-0 SH 18 (SUTURE) ×3 IMPLANT
SUT VIC AB 2-0 SH 27 (SUTURE)
SUT VIC AB 2-0 SH 27XBRD (SUTURE) IMPLANT
SUT VICRYL 3-0 CR8 SH (SUTURE) IMPLANT
SYR CONTROL 10ML LL (SYRINGE) ×3 IMPLANT
TOWEL GREEN STERILE FF (TOWEL DISPOSABLE) ×3 IMPLANT
TOWEL OR NON WOVEN STRL DISP B (DISPOSABLE) ×3 IMPLANT
TUBE CONNECTING 20'X1/4 (TUBING)
TUBE CONNECTING 20X1/4 (TUBING) IMPLANT
YANKAUER SUCT BULB TIP NO VENT (SUCTIONS) IMPLANT

## 2018-03-17 NOTE — Anesthesia Preprocedure Evaluation (Addendum)
Anesthesia Evaluation  Patient identified by MRN, date of birth, ID band Patient awake    Reviewed: Allergy & Precautions, NPO status , Patient's Chart, lab work & pertinent test results  Airway Mallampati: II  TM Distance: >3 FB Neck ROM: Full    Dental no notable dental hx. (+) Teeth Intact, Dental Advisory Given   Pulmonary neg pulmonary ROS,    Pulmonary exam normal breath sounds clear to auscultation       Cardiovascular hypertension, Pt. on medications negative cardio ROS Normal cardiovascular exam Rhythm:Regular Rate:Normal     Neuro/Psych negative neurological ROS  negative psych ROS   GI/Hepatic negative GI ROS, Neg liver ROS,   Endo/Other  negative endocrine ROS  Renal/GU negative Renal ROS  negative genitourinary   Musculoskeletal negative musculoskeletal ROS (+)   Abdominal   Peds  Hematology negative hematology ROS (+)   Anesthesia Other Findings Ventral hernia  Reproductive/Obstetrics                            Anesthesia Physical Anesthesia Plan  ASA: II  Anesthesia Plan: General and Regional   Post-op Pain Management:  Regional for Post-op pain   Induction: Intravenous  PONV Risk Score and Plan: 2  Airway Management Planned: Oral ETT  Additional Equipment:   Intra-op Plan:   Post-operative Plan: Extubation in OR  Informed Consent: I have reviewed the patients History and Physical, chart, labs and discussed the procedure including the risks, benefits and alternatives for the proposed anesthesia with the patient or authorized representative who has indicated his/her understanding and acceptance.   Dental advisory given  Plan Discussed with: CRNA  Anesthesia Plan Comments:         Anesthesia Quick Evaluation

## 2018-03-17 NOTE — Anesthesia Procedure Notes (Signed)
Procedure Name: Intubation Performed by: Terrance Mass, CRNA Pre-anesthesia Checklist: Patient identified, Emergency Drugs available, Suction available and Patient being monitored Patient Re-evaluated:Patient Re-evaluated prior to induction Oxygen Delivery Method: Circle system utilized Preoxygenation: Pre-oxygenation with 100% oxygen Induction Type: IV induction Ventilation: Mask ventilation without difficulty Laryngoscope Size: Miller and 2 Grade View: Grade II Tube type: Oral Number of attempts: 1 Airway Equipment and Method: Stylet Placement Confirmation: ETT inserted through vocal cords under direct vision,  positive ETCO2 and breath sounds checked- equal and bilateral Tube secured with: Tape Dental Injury: Teeth and Oropharynx as per pre-operative assessment

## 2018-03-17 NOTE — Transfer of Care (Signed)
Immediate Anesthesia Transfer of Care Note  Patient: Sean [name]  Procedure(s) Performed: REPAIR VENTRAL HERNIA WITH MESH (N/A Abdomen) INSERTION OF MESH (N/A Abdomen)  Patient Location: PACU  Anesthesia Type:General and Regional  Level of Consciousness: awake and sedated  Airway & Oxygen Therapy: Patient Spontanous Breathing and Patient connected to face mask oxygen  Post-op Assessment: Report given to RN and Post -op Vital signs reviewed and stable  Post vital signs: Reviewed and stable  Last Vitals:  Vitals Value Taken Time  BP 136/87 03/17/2018  1:13 PM  Temp    Pulse 69 03/17/2018  1:15 PM  Resp 11 03/17/2018  1:15 PM  SpO2 100 % 03/17/2018  1:15 PM  Vitals shown include unvalidated device data.  Last Pain:  Vitals:   03/17/18 1044  TempSrc: Oral  PainSc: 0-No pain         Complications: No apparent anesthesia complications

## 2018-03-17 NOTE — Discharge Instructions (Signed)
[facility] _______[facility]  UMBILICAL OR INGUINAL HERNIA REPAIR: POST OP INSTRUCTIONS  Always review your discharge instruction sheet given to you by the facility where your surgery was performed. IF YOU HAVE DISABILITY OR FAMILY LEAVE FORMS, YOU MUST BRING THEM TO THE OFFICE FOR PROCESSING.   DO NOT GIVE THEM TO YOUR DOCTOR.  1. A  prescription for pain medication may be given to you upon discharge.  Take your pain medication as prescribed, if needed.  If narcotic pain medicine is not needed, then you may take acetaminophen (Tylenol) or ibuprofen (Advil) as needed. 2. Take your usually prescribed medications unless otherwise directed. If you need a refill on your pain medication, please contact your pharmacy.  They will contact our office to request authorization. Prescriptions will not be filled after 5 pm or on week-ends. 3. You should follow a light diet the first 24 hours after arrival home, such as soup and crackers, etc.  Be sure to include lots of fluids daily.  Resume your normal diet the day after surgery. 4.Most patients will experience some swelling and bruising around the umbilicus or in the groin and scrotum.  Ice packs and reclining will help.  Swelling and bruising can take several days to resolve.  6. It is common to experience some constipation if taking pain medication after surgery.  Increasing fluid intake and taking a stool softener (such as Colace) will usually help or prevent this problem from occurring.  A mild laxative (Milk of Magnesia or Miralax) should be taken according to package directions if there are no bowel movements after 48 hours. 7. Unless discharge instructions indicate otherwise, you may remove your bandages 24-48 hours after surgery, and you may shower at that time.  You may have steri-strips (small skin tapes) in place directly over the incision.  These strips should be left on the skin for 7-10 days.  If your surgeon used skin glue on the  incision, you may shower in 24 hours.  The glue will flake off over the next 2-3 weeks.  Any sutures or staples will be removed at the office during your follow-up visit. 8. ACTIVITIES:  You may resume regular (light) daily activities beginning the next day--such as daily self-care, walking, climbing stairs--gradually increasing activities as tolerated.  You may have sexual intercourse when it is comfortable.  Refrain from any heavy lifting or straining until approved by your doctor.  a.You may drive when you are no longer taking prescription pain medication, you can comfortably wear a seatbelt, and you can safely maneuver your car and apply brakes. b.RETURN TO WORK:   _____________MAY RETURN TO WORK IN 3 WORKING DAYS ________________________________  9.You should see your doctor in the office for a follow-up appointment approximately 2-3 weeks after your surgery.  Make sure that you call for this appointment within a day or two after you arrive home to insure a convenient appointment time. 10.OTHER INSTRUCTIONS: _________________________    _____________________________________  WHEN TO CALL YOUR DOCTOR: 1. Fever over 101.0 2. Inability to urinate 3. Nausea and/or vomiting 4. Extreme swelling or bruising 5. Continued bleeding from incision. 6. Increased pain, redness, or drainage from the incision  The clinic staff is available to answer your questions during regular business hours.  Please dont hesitate to call and ask to speak to one of the nurses for clinical concerns.  If you have a medical emergency, go to the nearest emergency room or call 911.  A surgeon from [facility] is  always on call at the hospital   [address] [place], [place], [place]  [address] ?  [address], [place], [place]   [address] [phone] ? [phone] ? FAX [phone] Web site: [email or web address]   Post Anesthesia Home Care Instructions  Activity: Get plenty of  rest for the remainder of the day. A responsible individual must stay with you for 24 hours following the procedure.  For the next 24 hours, DO NOT: -Drive a car -Paediatric nurse -Drink alcoholic beverages -Take any medication unless instructed by your physician -Make any legal decisions or sign important papers.  Meals: Start with liquid foods such as gelatin or soup. Progress to regular foods as tolerated. Avoid greasy, spicy, heavy foods. If nausea and/or vomiting occur, drink only clear liquids until the nausea and/or vomiting subsides. Call your physician if vomiting continues.  Special Instructions/Symptoms: Your throat may feel dry or sore from the anesthesia or the breathing tube placed in your throat during surgery. If this causes discomfort, gargle with warm salt water. The discomfort should disappear within 24 hours.  If you had a scopolamine patch placed behind your ear for the management of post- operative nausea and/or vomiting:  1. The medication in the patch is effective for 72 hours, after which it should be removed.  Wrap patch in a tissue and discard in the trash. Wash hands thoroughly with soap and water. 2. You may remove the patch earlier than 72 hours if you experience unpleasant side effects which may include dry mouth, dizziness or visual disturbances. 3. Avoid touching the patch. Wash your hands with soap and water after contact with the patch.

## 2018-03-17 NOTE — H&P (Signed)
Lidia Collum Documented: 01/31/2018 11:45 AM Location: [facility] Patient #: [id number] DOB: Oct 17, 1960 Married / Language: Cleophus Molt / Race: White Male  History of Present Illness Marcello Moores A. Rithwik Schmieg MD; 01/31/2018 12:20 PM) Patient words: Patient is sent at the request of Dr. Jerline Pain for a bulge in the patient's epigastrium over his abdomen. It has been  present for more than one year. It causes mild to moderate discomfort especially when coughing or sneezing. The bulge is located  to the right umbilicus and just superior to that. It is full and gets larger with standing, coughing or straining. He has no change to his bowel or bladder function. An ultrasound was done which shows a ventral hernia at that site.  The patient is a 57 year old male.   Past Surgical HistoryKnee Surgery Left.  Diagnostic Studies History Colonoscopy 1-5 years ago  Allergies Sabino Gasser;No Known Drug AllergiesAllergies Reconciled  Medication History (Angela HolmesAtorvastatin Calcium (20MG  Tablet, Oral) Active. Lisinopril (10MG  Tablet, Oral) Active. Sildenafil Citrate (20MG  Tablet, Oral) Active. Medications Reconciled  Social History Sabino Gasser Alcohol use Occasional alcohol use. Caffeine use Coffee. No drug use Tobacco use Never smoker.  Family History Sabino Gasser; Colon Cancer Father. Ovarian Cancer Mother.  Other Problems Sabino Gasser Back Pain Chest pain Diabetes Mellitus Enlarged Prostate High blood pressure Pancreatitis Umbilical Hernia Repair     Review of Systems Sabino Gasser;  General Not Present- Appetite Loss, Chills, Fatigue, Fever, Night Sweats, Weight Gain and Weight Loss. Skin Not Present- Change in Wart/Mole, Dryness, Hives, Jaundice, New Lesions, Non-Healing Wounds, Rash and Ulcer. HEENT Present- Seasonal Allergies. Not Present- Earache, Hearing Loss, Hoarseness, Nose Bleed, Oral Ulcers, Ringing in the Ears, Sinus  Pain, Sore Throat, Visual Disturbances, Wears glasses/contact lenses and Yellow Eyes. Respiratory Not Present- Bloody sputum, Chronic Cough, Difficulty Breathing, Snoring and Wheezing. Breast Not Present- Breast Mass, Breast Pain, Nipple Discharge and Skin Changes. Cardiovascular Not Present- Chest Pain, Difficulty Breathing Lying Down, Leg Cramps, Palpitations, Rapid Heart Rate, Shortness of Breath and Swelling of Extremities. Gastrointestinal Not Present- Abdominal Pain, Bloating, Bloody Stool, Change in Bowel Habits, Chronic diarrhea, Constipation, Difficulty Swallowing, Excessive gas, Gets full quickly at meals, Hemorrhoids, Indigestion, Nausea, Rectal Pain and Vomiting. Male Genitourinary Not Present- Blood in Urine, Change in Urinary Stream, Frequency, Impotence, Nocturia, Painful Urination, Urgency and Urine Leakage. Musculoskeletal Not Present- Back Pain, Joint Pain, Joint Stiffness, Muscle Pain, Muscle Weakness and Swelling of Extremities. Neurological Not Present- Decreased Memory, Fainting, Headaches, Numbness, Seizures, Tingling, Tremor, Trouble walking and Weakness. Psychiatric Not Present- Anxiety, Bipolar, Change in Sleep Pattern, Depression, Fearful and Frequent crying. Endocrine Not Present- Cold Intolerance, Excessive Hunger, Hair Changes, Heat Intolerance, Hot flashes and New Diabetes. Hematology Not Present- Blood Thinners, Easy Bruising, Excessive bleeding, Gland problems, HIV and Persistent Infections.  Vitals Levada Dy Holmes;01/31/2018 11:46 AM Weight: 227.25 lb Height: 75in Body Surface Area: 2.32 m Body Mass Index: 28.4 kg/m  Temp.: 98.4F(Oral)  Pulse: 89 (Regular)  BP: 128/80 (Sitting, Left Arm, Standard)      Physical Exam   General Mental Status-Alert. General Appearance-Consistent with stated age. Hydration-Well hydrated. Voice-Normal.  Head and Neck Head-normocephalic, atraumatic with no lesions or palpable  masses. Trachea-midline. Thyroid Gland Characteristics - normal size and consistency.  Eye Eyeball - Bilateral-Extraocular movements intact. Sclera/Conjunctiva - Bilateral-No scleral icterus.  Chest and Lung Exam Chest and lung exam reveals -quiet, even and easy respiratory effort with no use of accessory muscles and on auscultation, normal breath sounds, no adventitious sounds and normal vocal  resonance. Inspection Chest Wall - Normal. Back - normal.  Cardiovascular Cardiovascular examination reveals -normal heart sounds, regular rate and rhythm with no murmurs and normal pedal pulses bilaterally.  Abdomen Note: bulge partally reducible nontender   Neurologic Neurologic evaluation reveals -alert and oriented x 3 with no impairment of recent or remote memory. Mental Status-Normal.  Musculoskeletal Normal Exam - Left-Upper Extremity Strength Normal and Lower Extremity Strength Normal. Normal Exam - Right-Upper Extremity Strength Normal and Lower Extremity Strength Normal.    Assessment & Plan  VENTRAL HERNIA WITHOUT OBSTRUCTION OR GANGRENE (K43.9) Impression: Discussed laparoscopic and open repair techniques with the use of mesh. Risks, benefits and long-term expectations of each operation discussed. Discussed nonoperative management. The patient has opted for open repair of his ventral hernia with mesh. The risk of hernia repair include bleeding, infection, organ injury, bowel injury, bladder injury, nerve injury recurrent hernia, blood clots, worsening of underlying condition, chronic pain, mesh use, open surgery, death, and the need for other operattions. Pt agrees to proceed  Current Plans Pt Education - Pamphlet Given - Hernia Surgery: discussed with patient and provided information. The anatomy & physiology of the abdominal wall was discussed. The pathophysiology of hernias was discussed. Natural history risks without surgery including  progeressive enlargement, pain, incarceration, & strangulation was discussed. Contributors to complications such as smoking, obesity, diabetes, prior surgery, etc were discussed.  I feel the risks of no intervention will lead to serious problems that outweigh the operative risks; therefore, I recommended surgery to reduce and repair the hernia. I explained laparoscopic techniques with open approach. I noted the probable use of mesh to patch and/or buttress the hernia repair  Risks such as bleeding, infection, abscess, need for further treatment, heart attack, death, and other risks were discussed. I noted a good likelihood this will help address the problem. Goals of post-operative recovery were discussed as well. Possibility that this will not correct all symptoms was explained. I stressed the importance of low-impact activity, aggressive pain control, avoiding constipation, & not pushing through pain to minimize risk of post-operative chronic pain or injury. Possibility of reherniation especially with smoking, obesity, diabetes, immunosuppression, and other health conditions was discussed. We will work to minimize complications.  An educational handout further explaining the pathology & treatment options was given as well. Questions were answered. The patient expresses understanding & wishes to proceed with surgery.  Pt Education - CCS Mesh education: discussed with patient and provided information

## 2018-03-17 NOTE — Progress Notes (Signed)
Assisted D. Woodrum with right, left, ultrasound guided, transabdominal plane block. Side rails up, monitors on throughout procedure. See vital signs in flow sheet. Tolerated Procedure well.

## 2018-03-17 NOTE — Op Note (Signed)
Preoperative diagnosis: Reducible ventral hernia  Postoperative diagnosis: Same  Procedure: Repair of ventral hernia with mesh  Surgeon: Erroll Luna, MD  Anesthesia: General with bilateral TAP blocks and local  EBL: 10 cc  Specimen none nuclear  Drains none  IV fluids: Per anesthesia record  Indications for procedure: The patient is a 57 year old male with an epigastric hernia.  This is just above his umbilicus.  Risk, benefits and other options of treatment of hernias were discussed and the pathophysiology of hernias discussed as well.  The use of mesh and the pros and cons of that were discussed.The risk of hernia repair include bleeding,  Infection,   Recurrence of the hernia,  Mesh use, chronic pain,  Organ injury,  Bowel injury,  Bladder injury,   nerve injury with numbness around the incision,  Death,  and worsening of preexisting  medical problems.  The alternatives to surgery have been discussed as well..  Long term expectations of both operative and non operative treatments have been discussed.   The patient agrees to proceed.   Description of procedure: The patient was met in the holding area and questions were answered.  After anesthesia placed both blocks bilaterally, he was taken back to the operative room placed supine upon the operating room table.  After induction of general anesthesia the abdomen was prepped and draped in sterile fashion timeout was done.  He received preoperative antibiotics.  The hernia was in the epigastrium approximately 4 fingerbreadths above the umbilicus.  Incision was made from the umbilicus toward the hernia.  The hernia actually was was higher on the abdominal wall therefore extended my incision about a centimeter.  Is able to identify the hernia that contained preperitoneal fat reduces back under the fascia.  I swept under the fascia and there is about a 2-1/2 cm defect noted.  A 6 cm piece of ventral Lex mesh was placed in a subfascial position.   This was secured with #1 Novafil.  The fascia was closed over the mesh with #1 Novafil.  I then closed the subcutaneous tissue with 2-0 Vicryl.  4-0 Monocryl was used to close skin.  All counts were correct.  Dermabond applied.  Local anesthetic was infiltrated around the incision.  Patient was awoke extubated taken to recovery in satisfactory condition.

## 2018-03-17 NOTE — Anesthesia Procedure Notes (Signed)
Anesthesia Regional Block: TAP block   Pre-Anesthetic Checklist: ,, timeout performed, Correct Patient, Correct Site, Correct Laterality, Correct Procedure, Correct Position, site marked, Risks and benefits discussed,  Surgical consent,  Pre-op evaluation,  At surgeon's request and post-op pain management  Laterality: Right  Prep: Maximum Sterile Barrier Precautions used, chloraprep       Needles:  Injection technique: Single-shot  Needle Type: Echogenic Stimulator Needle     Needle Length: 9cm  Needle Gauge: 22     Additional Needles:   Procedures:,,,, ultrasound used (permanent image in chart),,,,  Narrative:  Start time: 03/17/2018 11:34 AM End time: 03/17/2018 11:44 AM Injection made incrementally with aspirations every 5 mL.  Performed by: Personally  Anesthesiologist: Freddrick March, MD  Additional Notes: Monitors applied. No increased pain on injection. No increased resistance to injection. Injection made in 5cc increments. Good needle visualization. Patient tolerated procedure well.

## 2018-03-17 NOTE — Anesthesia Procedure Notes (Signed)
Anesthesia Regional Block: TAP block   Pre-Anesthetic Checklist: ,, timeout performed, Correct Patient, Correct Site, Correct Laterality, Correct Procedure, Correct Position, site marked, Risks and benefits discussed,  Surgical consent,  Pre-op evaluation,  At surgeon's request and post-op pain management  Laterality: Left  Prep: Maximum Sterile Barrier Precautions used, chloraprep       Needles:  Injection technique: Single-shot  Needle Type: Echogenic Stimulator Needle     Needle Length: 9cm  Needle Gauge: 22     Additional Needles:   Procedures:,,,, ultrasound used (permanent image in chart),,,,  Narrative:  Start time: 03/17/2018 11:45 AM End time: 03/17/2018 11:55 AM Injection made incrementally with aspirations every 5 mL.  Performed by: Personally  Anesthesiologist: Freddrick March, MD  Additional Notes: Monitors applied. No increased pain on injection. No increased resistance to injection. Injection made in 5cc increments. Good needle visualization. Patient tolerated procedure well.

## 2018-03-18 ENCOUNTER — Encounter (HOSPITAL_BASED_OUTPATIENT_CLINIC_OR_DEPARTMENT_OTHER): Payer: Self-pay | Admitting: Surgery

## 2018-03-18 NOTE — Anesthesia Postprocedure Evaluation (Signed)
Anesthesia Post Note  Patient: Sean [name]  Procedure(s) Performed: REPAIR VENTRAL HERNIA WITH MESH (N/A Abdomen) INSERTION OF MESH (N/A Abdomen)     Patient location during evaluation: PACU Anesthesia Type: Regional Level of consciousness: awake and alert Pain management: pain level controlled Vital Signs Assessment: post-procedure vital signs reviewed and stable Respiratory status: spontaneous breathing, nonlabored ventilation, respiratory function stable and patient connected to nasal cannula oxygen Cardiovascular status: blood pressure returned to baseline and stable Postop Assessment: no apparent nausea or vomiting Anesthetic complications: no    Last Vitals:  Vitals:   03/17/18 1412 03/17/18 1500  BP:  (!) 135/97  Pulse: 70 65  Resp: 19 18  Temp:  36.6 C  SpO2: 98% 96%    Last Pain:  Vitals:   03/17/18 1515  TempSrc:   PainSc: 2                  Chelsey L Woodrum

## 2018-07-11 ENCOUNTER — Other Ambulatory Visit: Payer: Self-pay | Admitting: Family Medicine

## 2018-11-17 ENCOUNTER — Ambulatory Visit: Payer: 59 | Admitting: Family Medicine

## 2018-11-17 ENCOUNTER — Encounter: Payer: Self-pay | Admitting: Family Medicine

## 2018-11-17 ENCOUNTER — Other Ambulatory Visit: Payer: Self-pay

## 2018-11-17 VITALS — BP 124/78 | HR 67 | Temp 98.3°F | Ht 74.0 in | Wt 221.4 lb

## 2018-11-17 DIAGNOSIS — M545 Low back pain, unspecified: Secondary | ICD-10-CM

## 2018-11-17 MED ORDER — CYCLOBENZAPRINE HCL 10 MG PO TABS: 10.0000 mg | ORAL_TABLET | Freq: Three times a day (TID) | ORAL | 0 refills | Status: DC | PRN

## 2018-11-17 MED ORDER — DICLOFENAC SODIUM 75 MG PO TBEC: 75.0000 mg | DELAYED_RELEASE_TABLET | Freq: Two times a day (BID) | ORAL | 0 refills | Status: DC

## 2018-11-17 NOTE — Patient Instructions (Signed)
It was very nice to see you today!  You have a low back strain. Please take the diclofenac twice daily for the next 1-2 weeks. Use the flexeril as needed.  Work on the exercises.  Please continue using a heating pad.  Let me know if no improvement in the next 7-10 days.   Take care, Dr Jerline Pain

## 2018-11-17 NOTE — Progress Notes (Signed)
   Chief Complaint:  Sean [name] is a 58 y.o. male who presents for same day appointment with a chief complaint of back pain.   Assessment/Plan:  Low Back Strain No red flags.  Start diclofenac 75 mg twice daily for the next 1 to 2 weeks.  Start Flexeril 10 mg 3 times daily as needed.  Discussed home exercise program and handout was given.  Also continue using heating pad. Discussed reasons to return to care and seek emergent care. Follow up as needed.      Subjective:  HPI:  Back Pain, acute problem Started a few weeks ago. Was getting better but has worsened over the past few days. Has tried taking tylenol with minimal improvement.  Initially injured it a few weeks ago while suddenly getting into his car because he thought it was moving. Pain was level over that time until worsening a few days ago.  No clear reason for recent exacerbation.  Pain is worse with certain movements.  No hematuria or dysuria.  No reported bowel or bladder incontinence.  Has been told that he has "arthritis" in his back before.  No other obvious alleviating or aggravating factors.   ROS: Per HPI  PMH: He reports that he has never smoked. He has never used smokeless tobacco. He reports current alcohol use. He reports that he does not use drugs.      Objective:  Physical Exam: BP 124/78 (BP Location: Left Arm, Patient Position: Sitting, Cuff Size: Normal)   Pulse 67   Temp 98.3 F (36.8 C) (Oral)   Ht 6\' 2"  (1.88 m)   Wt 221 lb 6.4 oz (100.4 kg)   SpO2 97%   BMI 28.43 kg/m   Gen: NAD, resting comfortably MSK: -Back: No deformities.  Tender to palpation along lower lumbar paraspinal muscles. - Lower extremities: No deformities.  Strength out of 5 throughout.     Sean [name]. Jerline Pain, MD 11/17/2018 9:44 AM

## 2018-12-26 ENCOUNTER — Encounter: Payer: Self-pay | Admitting: Family Medicine

## 2018-12-26 ENCOUNTER — Ambulatory Visit (INDEPENDENT_AMBULATORY_CARE_PROVIDER_SITE_OTHER): Payer: 59 | Admitting: Family Medicine

## 2018-12-26 ENCOUNTER — Other Ambulatory Visit: Payer: Self-pay

## 2018-12-26 VITALS — BP 126/82 | HR 72 | Temp 98.2°F | Wt 219.0 lb

## 2018-12-26 DIAGNOSIS — R739 Hyperglycemia, unspecified: Secondary | ICD-10-CM | POA: Diagnosis not present

## 2018-12-26 DIAGNOSIS — Z0001 Encounter for general adult medical examination with abnormal findings: Secondary | ICD-10-CM

## 2018-12-26 DIAGNOSIS — E785 Hyperlipidemia, unspecified: Secondary | ICD-10-CM

## 2018-12-26 DIAGNOSIS — I10 Essential (primary) hypertension: Secondary | ICD-10-CM | POA: Diagnosis not present

## 2018-12-26 DIAGNOSIS — Z125 Encounter for screening for malignant neoplasm of prostate: Secondary | ICD-10-CM | POA: Diagnosis not present

## 2018-12-26 DIAGNOSIS — E663 Overweight: Secondary | ICD-10-CM

## 2018-12-26 DIAGNOSIS — N529 Male erectile dysfunction, unspecified: Secondary | ICD-10-CM

## 2018-12-26 DIAGNOSIS — Z6828 Body mass index (BMI) 28.0-28.9, adult: Secondary | ICD-10-CM

## 2018-12-26 LAB — LIPID PANEL
Cholesterol: 159 mg/dL (ref 0–200)
HDL: 41.8 mg/dL (ref >39.00)
LDL Cholesterol: 90 mg/dL (ref 0–99)
NonHDL: 117.09
Total CHOL/HDL Ratio: 4
Triglycerides: 136 mg/dL (ref 0.0–149.0)
VLDL: 27.2 mg/dL (ref 0.0–40.0)

## 2018-12-26 LAB — COMPREHENSIVE METABOLIC PANEL
ALT: 27 U/L (ref 0–53)
AST: 18 U/L (ref 0–37)
Albumin: 4.4 g/dL (ref 3.5–5.2)
Alkaline Phosphatase: 40 U/L (ref 39–117)
BUN: 18 mg/dL (ref 6–23)
CO2: 26 mEq/L (ref 19–32)
Calcium: 9.3 mg/dL (ref 8.4–10.5)
Chloride: 106 mEq/L (ref 96–112)
Creatinine, Ser: 0.8 mg/dL (ref 0.40–1.50)
GFR: 99.17 mL/min (ref >60.00)
Glucose, Bld: 118 mg/dL — ABNORMAL HIGH (ref 70–99)
Potassium: 4.2 mEq/L (ref 3.5–5.1)
Sodium: 141 mEq/L (ref 135–145)
Total Bilirubin: 0.5 mg/dL (ref 0.2–1.2)
Total Protein: 6.6 g/dL (ref 6.0–8.3)

## 2018-12-26 LAB — PSA: PSA: 2.2 ng/mL (ref 0.10–4.00)

## 2018-12-26 LAB — HEMOGLOBIN A1C: Hgb A1c MFr Bld: 5.9 % (ref 4.6–6.5)

## 2018-12-26 LAB — CBC
HCT: 41.1 % (ref 39.0–52.0)
Hemoglobin: 13.9 g/dL (ref 13.0–17.0)
MCHC: 33.7 g/dL (ref 30.0–36.0)
MCV: 90.5 fl (ref 78.0–100.0)
Platelets: 280 10*3/uL (ref 150.0–400.0)
RBC: 4.55 Mil/uL (ref 4.22–5.81)
RDW: 13.5 % (ref 11.5–15.5)
WBC: 4.9 10*3/uL (ref 4.0–10.5)

## 2018-12-26 LAB — TSH: TSH: 1.06 u[IU]/mL (ref 0.35–4.50)

## 2018-12-26 MED ORDER — SILDENAFIL CITRATE 20 MG PO TABS: 20.0000 mg | ORAL_TABLET | Freq: Every day | ORAL | 3 refills | Status: DC | PRN

## 2018-12-26 MED ORDER — ATORVASTATIN CALCIUM 20 MG PO TABS: 20.0000 mg | ORAL_TABLET | Freq: Every day | ORAL | 3 refills | Status: DC

## 2018-12-26 MED ORDER — LISINOPRIL 10 MG PO TABS: 10.0000 mg | ORAL_TABLET | Freq: Every day | ORAL | 3 refills | Status: DC

## 2018-12-26 NOTE — Assessment & Plan Note (Signed)
At goal.  Continue lisinopril 10 mg daily.  Check CBC and C met.

## 2018-12-26 NOTE — Progress Notes (Signed)
Please inform patient of the following:  Good news! His A1c  and cholesterol have improved since last year. HE is still in the prediabetic range like last year but all of his other labs are NORMAL. Would like for him to keep up the good work and we can recheck in a year.  Sean [name]. Jerline Pain, MD 12/26/2018 1:33 PM

## 2018-12-26 NOTE — Patient Instructions (Signed)
It was very nice to see you today!  Keep up the good work!  We will check blood work today.  I will send in your medications.  Come back to see me in 1 year for your next physical, or sooner if needed.   Take care, Dr Jerline Pain  Please try these tips to maintain a healthy lifestyle:   Eat at least 3 REAL meals and 1-2 snacks per day.  Aim for no more than 5 hours between eating.  If you eat breakfast, please do so within one hour of getting up.    Obtain twice as many fruits/vegetables as protein or carbohydrate foods for both lunch and dinner. (Half of each meal should be fruits/vegetables, one quarter protein, and one quarter starchy carbs)   Cut down on sweet beverages. This includes juice, soda, and sweet tea.    Exercise at least 150 minutes every week.    Preventive Care 78-60 Years Old, Male Preventive care refers to lifestyle choices and visits with your health care provider that can promote health and wellness. This includes:  A yearly physical exam. This is also called an annual well check.  Regular dental and eye exams.  Immunizations.  Screening for certain conditions.  Healthy lifestyle choices, such as eating a healthy diet, getting regular exercise, not using drugs or products that contain nicotine and tobacco, and limiting alcohol use. What can I expect for my preventive care visit? Physical exam Your health care provider will check:  Height and weight. These may be used to calculate body mass index (BMI), which is a measurement that tells if you are at a healthy weight.  Heart rate and blood pressure.  Your skin for abnormal spots. Counseling Your health care provider may ask you questions about:  Alcohol, tobacco, and drug use.  Emotional well-being.  Home and relationship well-being.  Sexual activity.  Eating habits.  Work and work Statistician. What immunizations do I need?  Influenza (flu) vaccine  This is recommended every year.  Tetanus, diphtheria, and pertussis (Tdap) vaccine  You may need a Td booster every 10 years. Varicella (chickenpox) vaccine  You may need this vaccine if you have not already been vaccinated. Zoster (shingles) vaccine  You may need this after age 20. Measles, mumps, and rubella (MMR) vaccine  You may need at least one dose of MMR if you were born in 1957 or later. You may also need a second dose. Pneumococcal conjugate (PCV13) vaccine  You may need this if you have certain conditions and were not previously vaccinated. Pneumococcal polysaccharide (PPSV23) vaccine  You may need one or two doses if you smoke cigarettes or if you have certain conditions. Meningococcal conjugate (MenACWY) vaccine  You may need this if you have certain conditions. Hepatitis A vaccine  You may need this if you have certain conditions or if you travel or work in places where you may be exposed to hepatitis A. Hepatitis B vaccine  You may need this if you have certain conditions or if you travel or work in places where you may be exposed to hepatitis B. Haemophilus influenzae type b (Hib) vaccine  You may need this if you have certain risk factors. Human papillomavirus (HPV) vaccine  If recommended by your health care provider, you may need three doses over 6 months. You may receive vaccines as individual doses or as more than one vaccine together in one shot (combination vaccines). Talk with your health care provider about the risks and benefits of  combination vaccines. What tests do I need? Blood tests  Lipid and cholesterol levels. These may be checked every 5 years, or more frequently if you are over 23 years old.  Hepatitis C test.  Hepatitis B test. Screening  Lung cancer screening. You may have this screening every year starting at age 89 if you have a 30-pack-year history of smoking and currently smoke or have quit within the past 15 years.  Prostate cancer screening. Recommendations  will vary depending on your family history and other risks.  Colorectal cancer screening. All adults should have this screening starting at age 36 and continuing until age 33. Your health care provider may recommend screening at age 3 if you are at increased risk. You will have tests every 1-10 years, depending on your results and the type of screening test.  Diabetes screening. This is done by checking your blood sugar (glucose) after you have not eaten for a while (fasting). You may have this done every 1-3 years.  Sexually transmitted disease (STD) testing. Follow these instructions at home: Eating and drinking  Eat a diet that includes fresh fruits and vegetables, whole grains, lean protein, and low-fat dairy products.  Take vitamin and mineral supplements as recommended by your health care provider.  Do not drink alcohol if your health care provider tells you not to drink.  If you drink alcohol: ? Limit how much you have to 0-2 drinks a day. ? Be aware of how much alcohol is in your drink. In the [place] one drink equals one 12 oz bottle of beer (355 mL), one 5 oz glass of wine (148 mL), or one 1 oz glass of hard liquor (44 mL). Lifestyle  Take daily care of your teeth and gums.  Stay active. Exercise for at least 30 minutes on 5 or more days each week.  Do not use any products that contain nicotine or tobacco, such as cigarettes, e-cigarettes, and chewing tobacco. If you need help quitting, ask your health care provider.  If you are sexually active, practice safe sex. Use a condom or other form of protection to prevent STIs (sexually transmitted infections).  Talk with your health care provider about taking a low-dose aspirin every day starting at age 71. What's next?  Go to your health care provider once a year for a well check visit.  Ask your health care provider how often you should have your eyes and teeth checked.  Stay up to date on all vaccines. This information is  not intended to replace advice given to you by your health care provider. Make sure you discuss any questions you have with your health care provider. Document Released: 05/24/2015 Document Revised: 04/21/2018 Document Reviewed: 04/21/2018 Elsevier Patient Education  2020 [employer].

## 2018-12-26 NOTE — Assessment & Plan Note (Signed)
Check lipid panel.  Continue atorvastatin 20 mg daily.

## 2018-12-26 NOTE — Assessment & Plan Note (Signed)
Stable. Continue sildenafil as needed. 

## 2018-12-26 NOTE — Progress Notes (Signed)
Chief Complaint:  Sean [name] is a 58 y.o. male who presents today for his annual comprehensive physical exam.    Assessment/Plan:  Hyperglycemia Check A1c.  Erectile dysfunction Stable.  Continue sildenafil as needed.  Hyperlipidemia Check lipid panel.  Continue atorvastatin 20 mg daily.  Hypertension At goal.  Continue lisinopril 10 mg daily.  Check CBC and C met.  Body mass index is 28.12 kg/m. / Overweight BMI Metric Follow Up - 12/26/18 0916      BMI Metric Follow Up-Please document annually   BMI Metric Follow Up  Education provided        Preventative Healthcare: Check CBC, C met, TSH, lipid panel, A1c, and PSA  Patient Counseling(The following topics were reviewed and/or handout was given):  -Nutrition: Stressed importance of moderation in sodium/caffeine intake, saturated fat and cholesterol, caloric balance, sufficient intake of fresh fruits, vegetables, and fiber.  -Stressed the importance of regular exercise.   -Substance Abuse: Discussed cessation/primary prevention of tobacco, alcohol, or other drug use; driving or other dangerous activities under the influence; availability of treatment for abuse.   -Injury prevention: Discussed safety belts, safety helmets, smoke detector, smoking near bedding or upholstery.   -Sexuality: Discussed sexually transmitted diseases, partner selection, use of condoms, avoidance of unintended pregnancy and contraceptive alternatives.   -Dental health: Discussed importance of regular tooth brushing, flossing, and dental visits.  -Health maintenance and immunizations reviewed. Please refer to Health maintenance section.  Return to care in 1 year for next preventative visit.     Subjective:  HPI:  He has no acute complaints today.   His stable, chronic medical conditions are outlined below:   # Dyslipidemia - On lipitor 51m daily and tolerating well - ROS: No reported myalgias  # Essential Hypertension - On lisinopril  13mdaily and tolerating well - ROS: No reported chest pain or shortness of breath  # Erectile Dysfunction - On sildenafil as needed and tolerating well  Lifestyle Diet: Balanced. Tries to eat plenty of fruit sand vegetables.  Exercise: Works out 30 minutes to an hour per day. Works on reElectrical engineernd elliptical   Depression screen PHQ 2/9 12/26/2018  Decreased Interest 0  Down, Depressed, Hopeless 0  PHQ - 2 Score 0   Health Maintenance Due  Topic Date Due  . TETANUS/TDAP  08/19/1979  . INFLUENZA VACCINE  12/10/2018     ROS: Per HPI, otherwise a complete review of systems was negative.   PMH:  The following were reviewed and entered/updated in epic: Past Medical History:  Diagnosis Date  . Hyperlipidemia   . Hypertension    Patient Active Problem List   Diagnosis Date Noted  . Hyperglycemia 12/24/2017  . Erectile dysfunction 12/23/2017  . Postnasal drip 12/23/2017  . Chronic right-sided low back pain without sciatica 12/23/2017  . Hypertension 12/25/2016  . Hyperlipidemia 12/25/2016  . Lipoma 12/25/2016  . Chronic pain of left knee 12/08/2016   Past Surgical History:  Procedure Laterality Date  . INSERTION OF MESH N/A 03/17/2018   Procedure: INSERTION OF MESH;  Surgeon: CoErroll LunaMD;  [facility];  Laterality: N/A;  . KNEE SURGERY     left knee arthroscopy  . VENTRAL HERNIA REPAIR N/A 03/17/2018   Procedure: REPAIR VENTRAL HERNIA WITH MESH;  Surgeon: CoErroll LunaMD;  [place] [facility] [facility];  Laterality: N/A;    Family History  Problem Relation Age of Onset  . Prostate cancer Father   .  Prostate cancer Paternal Uncle        diagnosed in 2007    Medications- reviewed and updated Current Outpatient Medications  Medication Sig Dispense Refill  . atorvastatin (LIPITOR) 20 MG tablet Take 1 tablet (20 mg total) by mouth daily. 90 tablet 3  . lisinopril (ZESTRIL) 10 MG  tablet Take 1 tablet (10 mg total) by mouth daily. 90 tablet 3  . Multiple Vitamin (MULTIVITAMIN) capsule Take 1 capsule by mouth daily.    . sildenafil (REVATIO) 20 MG tablet Take 1-5 tablets (20-100 mg total) by mouth daily as needed (erectile dysfunction). 90 tablet 3   No current facility-administered medications for this visit.     Allergies-reviewed and updated No Known Allergies  Social History   Socioeconomic History  . Marital status: Married    Spouse name: Not on file  . Number of children: 3  . Years of education: Not on file  . Highest education level: Not on file  Occupational History  . Not on file  Social Needs  . Financial resource strain: Not on file  . Food insecurity    Worry: Not on file    Inability: Not on file  . Transportation needs    Medical: Not on file    Non-medical: Not on file  Tobacco Use  . Smoking status: Never Smoker  . Smokeless tobacco: Never Used  Substance and Sexual Activity  . Alcohol use: Yes    Comment: Drinks rarely  . Drug use: No  . Sexual activity: Not on file  Lifestyle  . Physical activity    Days per week: Not on file    Minutes per session: Not on file  . Stress: Not on file  Relationships  . Social Herbalist on phone: Not on file    Gets together: Not on file    Attends religious service: Not on file    Active member of club or organization: Not on file    Attends meetings of clubs or organizations: Not on file    Relationship status: Not on file  Other Topics Concern  . Not on file  Social History Narrative  . Not on file        Objective:  Physical Exam: BP 126/82 (BP Location: Left Arm, Patient Position: Sitting, Cuff Size: Normal)   Pulse 72   Temp 98.2 F (36.8 C) (Oral)   Wt 219 lb (99.3 kg)   SpO2 98%   BMI 28.12 kg/m   Body mass index is 28.12 kg/m. Wt Readings from Last 3 Encounters:  12/26/18 219 lb (99.3 kg)  11/17/18 221 lb 6.4 oz (100.4 kg)  03/17/18 229 lb 4.5 oz (104  kg)   Gen: NAD, resting comfortably HEENT: TMs normal bilaterally. OP clear. No thyromegaly noted.  CV: RRR with no murmurs appreciated Pulm: NWOB, CTAB with no crackles, wheezes, or rhonchi GI: Normal bowel sounds present. Soft, Nontender, Nondistended. MSK: no edema, cyanosis, or clubbing noted Skin: warm, dry Neuro: CN2-12 grossly intact. Strength 5/5 in upper and lower extremities. Reflexes symmetric and intact bilaterally.  Psych: Normal affect and thought content     Zlaty Alexa M. Jerline Pain, MD 12/26/2018 9:16 AM

## 2018-12-26 NOTE — Assessment & Plan Note (Signed)
Check A1c. 

## 2020-01-01 ENCOUNTER — Encounter: Payer: Self-pay | Admitting: Family Medicine

## 2020-01-01 ENCOUNTER — Ambulatory Visit (INDEPENDENT_AMBULATORY_CARE_PROVIDER_SITE_OTHER): Payer: 59 | Admitting: Family Medicine

## 2020-01-01 ENCOUNTER — Other Ambulatory Visit: Payer: Self-pay

## 2020-01-01 VITALS — BP 118/73 | HR 75 | Temp 98.2°F | Ht 74.0 in | Wt 223.6 lb

## 2020-01-01 DIAGNOSIS — I10 Essential (primary) hypertension: Secondary | ICD-10-CM | POA: Diagnosis not present

## 2020-01-01 DIAGNOSIS — M2042 Other hammer toe(s) (acquired), left foot: Secondary | ICD-10-CM

## 2020-01-01 DIAGNOSIS — E785 Hyperlipidemia, unspecified: Secondary | ICD-10-CM

## 2020-01-01 DIAGNOSIS — Z125 Encounter for screening for malignant neoplasm of prostate: Secondary | ICD-10-CM

## 2020-01-01 DIAGNOSIS — R739 Hyperglycemia, unspecified: Secondary | ICD-10-CM

## 2020-01-01 DIAGNOSIS — Z6828 Body mass index (BMI) 28.0-28.9, adult: Secondary | ICD-10-CM

## 2020-01-01 DIAGNOSIS — Z0001 Encounter for general adult medical examination with abnormal findings: Secondary | ICD-10-CM

## 2020-01-01 DIAGNOSIS — N529 Male erectile dysfunction, unspecified: Secondary | ICD-10-CM

## 2020-01-01 NOTE — Assessment & Plan Note (Signed)
Stable. Check lipid panel. Continue atorvastatin 20mg  daily.

## 2020-01-01 NOTE — Patient Instructions (Signed)
It was very nice to see you today!  Keep up the good work.  You are doing a great job!  The numbness in your toes is likely related to age-related changes in the transverse arch of your foot.  Please let me know if your symptoms change in any way.  No other changes today.  We will check blood work today.  I will see back in year for your next physical.  Please come back to me sooner if needed.  Take care, Dr Jerline Pain  Please try these tips to maintain a healthy lifestyle:   Eat at least 3 REAL meals and 1-2 snacks per day.  Aim for no more than 5 hours between eating.  If you eat breakfast, please do so within one hour of getting up.    Each meal should contain half fruits/vegetables, one quarter protein, and one quarter carbs (no bigger than a computer mouse)   Cut down on sweet beverages. This includes juice, soda, and sweet tea.     Drink at least 1 glass of water with each meal and aim for at least 8 glasses per day   Exercise at least 150 minutes every week.    Preventive Care 72-54 Years Old, Male Preventive care refers to lifestyle choices and visits with your health care provider that can promote health and wellness. This includes:  A yearly physical exam. This is also called an annual well check.  Regular dental and eye exams.  Immunizations.  Screening for certain conditions.  Healthy lifestyle choices, such as eating a healthy diet, getting regular exercise, not using drugs or products that contain nicotine and tobacco, and limiting alcohol use. What can I expect for my preventive care visit? Physical exam Your health care provider will check:  Height and weight. These may be used to calculate body mass index (BMI), which is a measurement that tells if you are at a healthy weight.  Heart rate and blood pressure.  Your skin for abnormal spots. Counseling Your health care provider may ask you questions about:  Alcohol, tobacco, and drug use.  Emotional  well-being.  Home and relationship well-being.  Sexual activity.  Eating habits.  Work and work Statistician. What immunizations do I need?  Influenza (flu) vaccine  This is recommended every year. Tetanus, diphtheria, and pertussis (Tdap) vaccine  You may need a Td booster every 10 years. Varicella (chickenpox) vaccine  You may need this vaccine if you have not already been vaccinated. Zoster (shingles) vaccine  You may need this after age 40. Measles, mumps, and rubella (MMR) vaccine  You may need at least one dose of MMR if you were born in 1957 or later. You may also need a second dose. Pneumococcal conjugate (PCV13) vaccine  You may need this if you have certain conditions and were not previously vaccinated. Pneumococcal polysaccharide (PPSV23) vaccine  You may need one or two doses if you smoke cigarettes or if you have certain conditions. Meningococcal conjugate (MenACWY) vaccine  You may need this if you have certain conditions. Hepatitis A vaccine  You may need this if you have certain conditions or if you travel or work in places where you may be exposed to hepatitis A. Hepatitis B vaccine  You may need this if you have certain conditions or if you travel or work in places where you may be exposed to hepatitis B. Haemophilus influenzae type b (Hib) vaccine  You may need this if you have certain risk factors. Human papillomavirus (  HPV) vaccine  If recommended by your health care provider, you may need three doses over 6 months. You may receive vaccines as individual doses or as more than one vaccine together in one shot (combination vaccines). Talk with your health care provider about the risks and benefits of combination vaccines. What tests do I need? Blood tests  Lipid and cholesterol levels. These may be checked every 5 years, or more frequently if you are over 38 years old.  Hepatitis C test.  Hepatitis B test. Screening  Lung cancer screening.  You may have this screening every year starting at age 43 if you have a 30-pack-year history of smoking and currently smoke or have quit within the past 15 years.  Prostate cancer screening. Recommendations will vary depending on your family history and other risks.  Colorectal cancer screening. All adults should have this screening starting at age 76 and continuing until age 71. Your health care provider may recommend screening at age 29 if you are at increased risk. You will have tests every 1-10 years, depending on your results and the type of screening test.  Diabetes screening. This is done by checking your blood sugar (glucose) after you have not eaten for a while (fasting). You may have this done every 1-3 years.  Sexually transmitted disease (STD) testing. Follow these instructions at home: Eating and drinking  Eat a diet that includes fresh fruits and vegetables, whole grains, lean protein, and low-fat dairy products.  Take vitamin and mineral supplements as recommended by your health care provider.  Do not drink alcohol if your health care provider tells you not to drink.  If you drink alcohol: ? Limit how much you have to 0-2 drinks a day. ? Be aware of how much alcohol is in your drink. In the [place] one drink equals one 12 oz bottle of beer (355 mL), one 5 oz glass of wine (148 mL), or one 1 oz glass of hard liquor (44 mL). Lifestyle  Take daily care of your teeth and gums.  Stay active. Exercise for at least 30 minutes on 5 or more days each week.  Do not use any products that contain nicotine or tobacco, such as cigarettes, e-cigarettes, and chewing tobacco. If you need help quitting, ask your health care provider.  If you are sexually active, practice safe sex. Use a condom or other form of protection to prevent STIs (sexually transmitted infections).  Talk with your health care provider about taking a low-dose aspirin every day starting at age 3. What's next?  Go  to your health care provider once a year for a well check visit.  Ask your health care provider how often you should have your eyes and teeth checked.  Stay up to date on all vaccines. This information is not intended to replace advice given to you by your health care provider. Make sure you discuss any questions you have with your health care provider. Document Revised: 04/21/2018 Document Reviewed: 04/21/2018 Elsevier Patient Education  2020 [employer].

## 2020-01-01 NOTE — Progress Notes (Signed)
Chief Complaint:  Sean [name] is a 59 y.o. male who presents today for his annual comprehensive physical exam.    Assessment/Plan:  New/Acute Problems: Left Toe Numbness No red flags.  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  Patient does not wish for any further management at this time.   Chronic Problems Addressed Today: Hyperglycemia Check A1c.   Erectile dysfunction Stable. Continue sildenafil as needed.   Hyperlipidemia Stable. Check lipid panel. Continue atorvastatin 20mg  daily.   Hypertension At goal.  Continue lisinopril 10 mg daily.   Body mass index is 28.71 kg/m. / Overweight  BMI Metric Follow Up - 01/01/20 0850      BMI Metric Follow Up-Please document annually   BMI Metric Follow Up Education provided            Preventative Healthcare: Check CBC, CMET, TSH, lipid panel.  Has received both Covid vaccine.  Due for colonoscopy next year.  Patient Counseling(The following topics were reviewed and/or handout was given):  -Nutrition: Stressed importance of moderation in sodium/caffeine intake, saturated fat and cholesterol, caloric balance, sufficient intake of fresh fruits, vegetables, and fiber.  -Stressed the importance of regular exercise.   -Substance Abuse: Discussed cessation/primary prevention of tobacco, alcohol, or other drug use; driving or other dangerous activities under the influence; availability of treatment for abuse.   -Injury prevention: Discussed safety belts, safety helmets, smoke detector, smoking near bedding or upholstery.   -Sexuality: Discussed sexually transmitted diseases, partner selection, use of condoms, avoidance of unintended pregnancy and contraceptive alternatives.   -Dental health: Discussed importance of regular tooth brushing, flossing, and dental visits.  -Health maintenance and immunizations reviewed. Please refer to Health maintenance section.  Return  to care in 1 year for next preventative visit.     Subjective:  HPI:  Patient has had an issue with a "discomfort" in his left foot. When he is laying in bed he has noticed some numbness in the tips of his toes. No pain. Just in left foot. No weakness. No obvious trauma or other precipitating events.   Lifestyle Diet: Balanced.  Exercise: Cardio and weight training 3-4 times per week.   Depression screen PHQ 2/9 12/26/2018  Decreased Interest 0  Down, Depressed, Hopeless 0  PHQ - 2 Score 0   There are no preventive care reminders to display for this patient.  ROS: Per HPI, otherwise a complete review of systems was negative.   PMH:  The following were reviewed and entered/updated in epic: Past Medical History:  Diagnosis Date  . Hyperlipidemia   . Hypertension    Patient Active Problem List   Diagnosis Date Noted  . Hyperglycemia 12/24/2017  . Erectile dysfunction 12/23/2017  . Postnasal drip 12/23/2017  . Chronic right-sided low back pain without sciatica 12/23/2017  . Hypertension 12/25/2016  . Hyperlipidemia 12/25/2016  . Lipoma 12/25/2016  . Chronic pain of left knee 12/08/2016   Past Surgical History:  Procedure Laterality Date  . INSERTION OF MESH N/A 03/17/2018   Procedure: INSERTION OF MESH;  Surgeon: Erroll Luna, MD;  Location: [facility];  Service: General;  Laterality: N/A;  . KNEE SURGERY     left knee arthroscopy  . VENTRAL HERNIA REPAIR N/A 03/17/2018   Procedure: REPAIR VENTRAL HERNIA WITH MESH;  Surgeon: Erroll Luna, MD;  Location: [facility];  Service: General;  Laterality: N/A;    Family History  Problem Relation Age of Onset  .  Prostate cancer Father   . Prostate cancer Paternal Uncle        diagnosed in 2007    Medications- reviewed and updated Current Outpatient Medications  Medication Sig Dispense Refill  . atorvastatin (LIPITOR) 20 MG tablet Take 1 tablet (20 mg total) by mouth daily. 90 tablet 3  .  lisinopril (ZESTRIL) 10 MG tablet Take 1 tablet (10 mg total) by mouth daily. 90 tablet 3  . Multiple Vitamin (MULTIVITAMIN) capsule Take 1 capsule by mouth daily.    . sildenafil (REVATIO) 20 MG tablet Take 1-5 tablets (20-100 mg total) by mouth daily as needed (erectile dysfunction). 90 tablet 3   No current facility-administered medications for this visit.    Allergies-reviewed and updated No Known Allergies  Social History   Socioeconomic History  . Marital status: Married    Spouse name: Not on file  . Number of children: 3  . Years of education: Not on file  . Highest education level: Not on file  Occupational History  . Not on file  Tobacco Use  . Smoking status: Never Smoker  . Smokeless tobacco: Never Used  Vaping Use  . Vaping Use: Never used  Substance and Sexual Activity  . Alcohol use: Yes    Comment: Drinks rarely  . Drug use: No  . Sexual activity: Not on file  Other Topics Concern  . Not on file  Social History Narrative  . Not on file   Social Determinants of Health   Financial Resource Strain:   . Difficulty of Paying Living Expenses: Not on file  Food Insecurity:   . Worried About Charity fundraiser in the Last Year: Not on file  . Ran Out of Food in the Last Year: Not on file  Transportation Needs:   . Lack of Transportation (Medical): Not on file  . Lack of Transportation (Non-Medical): Not on file  Physical Activity:   . Days of Exercise per Week: Not on file  . Minutes of Exercise per Session: Not on file  Stress:   . Feeling of Stress : Not on file  Social Connections:   . Frequency of Communication with Friends and Family: Not on file  . Frequency of Social Gatherings with Friends and Family: Not on file  . Attends Religious Services: Not on file  . Active Member of Clubs or Organizations: Not on file  . Attends Archivist Meetings: Not on file  . Marital Status: Not on file        Objective:  Physical Exam: BP 118/73    Pulse 75   Temp 98.2 F (36.8 C) (Temporal)   Ht 6\' 2"  (1.88 m)   Wt 223 lb 9.6 oz (101.4 kg)   SpO2 97%   BMI 28.71 kg/m   Body mass index is 28.71 kg/m. Wt Readings from Last 3 Encounters:  01/01/20 223 lb 9.6 oz (101.4 kg)  12/26/18 219 lb (99.3 kg)  11/17/18 221 lb 6.4 oz (100.4 kg)   Gen: NAD, resting comfortably HEENT: TMs normal bilaterally. OP clear. No thyromegaly noted.  CV: RRR with no murmurs appreciated Pulm: NWOB, CTAB with no crackles, wheezes, or rhonchi GI: Normal bowel sounds present. Soft, Nontender, Nondistended. MSK: no edema, cyanosis, or clubbing noted.  Left foot with loss of transverse arch.  Neurovascular intact distally.  DP and TP pulses 2+.  Slightly decreased sensation along second and third digit distally.  Full range of motion throughout. Skin: warm, dry Neuro: CN2-12 grossly intact.  Strength 5/5 in upper and lower extremities. Reflexes symmetric and intact bilaterally.  Psych: Normal affect and thought content     Gunnar Hereford M. Jerline Pain, MD 01/01/2020 8:53 AM

## 2020-01-01 NOTE — Assessment & Plan Note (Signed)
Stable. Continue sildenafil as needed. 

## 2020-01-01 NOTE — Assessment & Plan Note (Signed)
Check A1c. 

## 2020-01-01 NOTE — Assessment & Plan Note (Signed)
At goal  Continue lisinopril 10mg daily

## 2020-01-02 LAB — LIPID PANEL
Cholesterol: 147 mg/dL (ref <200)
HDL: 43 mg/dL (ref >=40)
LDL Cholesterol (Calc): 80 mg/dL (calc)
Non-HDL Cholesterol (Calc): 104 mg/dL (calc) (ref <130)
Total CHOL/HDL Ratio: 3.4 (calc) (ref <5.0)
Triglycerides: 144 mg/dL (ref <150)

## 2020-01-02 LAB — CBC
HCT: 42.9 % (ref 38.5–50.0)
Hemoglobin: 13.9 g/dL (ref 13.2–17.1)
MCH: 29.4 pg (ref 27.0–33.0)
MCHC: 32.4 g/dL (ref 32.0–36.0)
MCV: 90.7 fL (ref 80.0–100.0)
MPV: 9.2 fL (ref 7.5–12.5)
Platelets: 274 10*3/uL (ref 140–400)
RBC: 4.73 10*6/uL (ref 4.20–5.80)
RDW: 13.1 % (ref 11.0–15.0)
WBC: 4.9 10*3/uL (ref 3.8–10.8)

## 2020-01-02 LAB — COMPREHENSIVE METABOLIC PANEL
AG Ratio: 1.8 (calc) (ref 1.0–2.5)
ALT: 19 U/L (ref 9–46)
AST: 16 U/L (ref 10–35)
Albumin: 4.2 g/dL (ref 3.6–5.1)
Alkaline phosphatase (APISO): 40 U/L (ref 35–144)
BUN: 23 mg/dL (ref 7–25)
CO2: 28 mmol/L (ref 20–32)
Calcium: 9.4 mg/dL (ref 8.6–10.3)
Chloride: 108 mmol/L (ref 98–110)
Creat: 0.78 mg/dL (ref 0.70–1.33)
Globulin: 2.4 g/dL (calc) (ref 1.9–3.7)
Glucose, Bld: 123 mg/dL — ABNORMAL HIGH (ref 65–99)
Potassium: 4.7 mmol/L (ref 3.5–5.3)
Sodium: 142 mmol/L (ref 135–146)
Total Bilirubin: 0.4 mg/dL (ref 0.2–1.2)
Total Protein: 6.6 g/dL (ref 6.1–8.1)

## 2020-01-02 LAB — HEMOGLOBIN A1C
Hgb A1c MFr Bld: 5.7 % of total Hgb — ABNORMAL HIGH (ref <5.7)
Mean Plasma Glucose: 117 (calc)
eAG (mmol/L): 6.5 (calc)

## 2020-01-02 LAB — TSH: TSH: 1.06 mIU/L (ref 0.40–4.50)

## 2020-01-02 LAB — PSA: PSA: 2 ng/mL (ref <=4.0)

## 2020-01-02 NOTE — Progress Notes (Signed)
Please inform patient of the following:  Blood work is all STABLE.Blood sugar and cholesterol levels are better than last year. Do not need to make any changes to his treatment plan at this time. Would like for him to keep working on diet and exercise and we can recheck in a year.  Sean [name]. Jerline Pain, MD 01/02/2020 8:01 AM

## 2020-01-25 ENCOUNTER — Other Ambulatory Visit: Payer: Self-pay | Admitting: Family Medicine

## 2020-02-02 ENCOUNTER — Other Ambulatory Visit: Payer: 59

## 2020-02-15 IMAGING — US US ABDOMEN LIMITED
1 series · 14 of 17 positions shown · non-contrast
Comparison: None.

CLINICAL DATA: 57-year-old male with chronic palpable epigastric
mass.

EXAM:
ULTRASOUND ABDOMEN LIMITED

[Series 1: us abdomen limited · 0.11mm/px · 17 acquisitions, 14 frames shown]
[im 1/17]
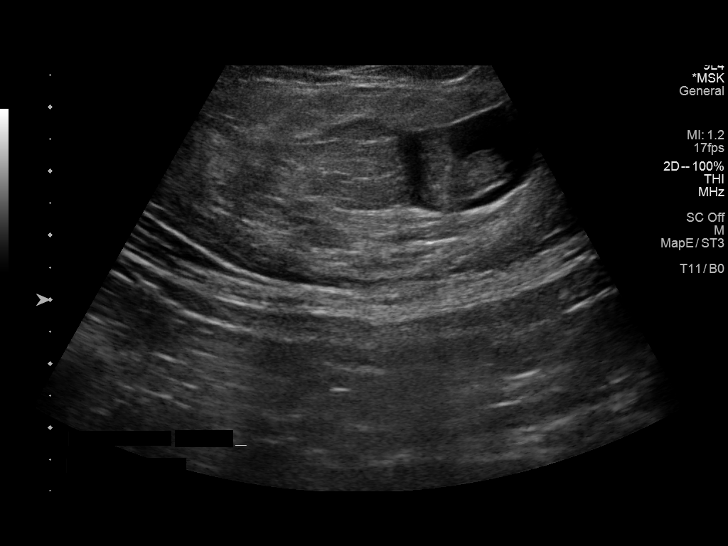
[im 2/17]
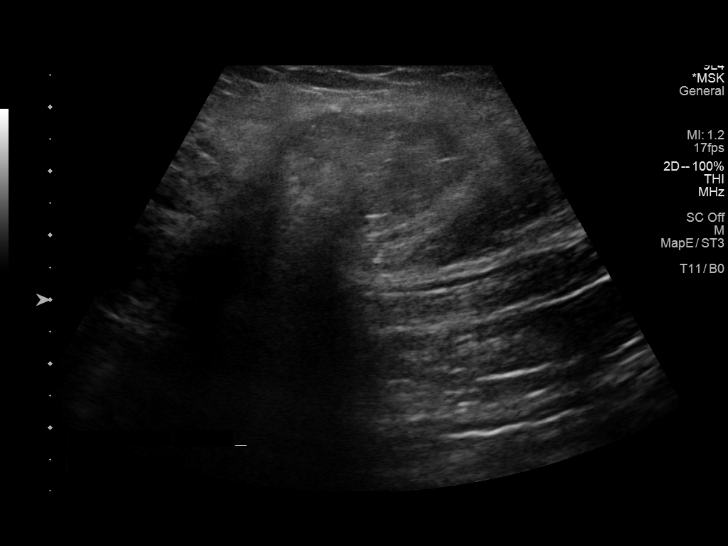
[im 4/17]
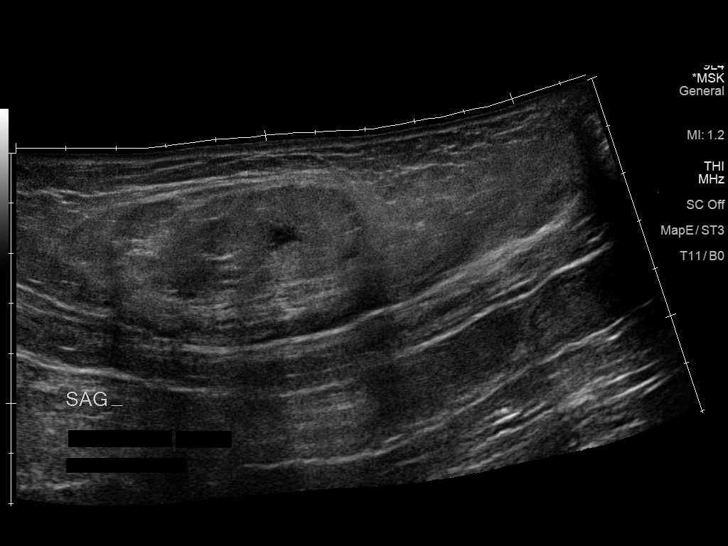
[im 5/17]
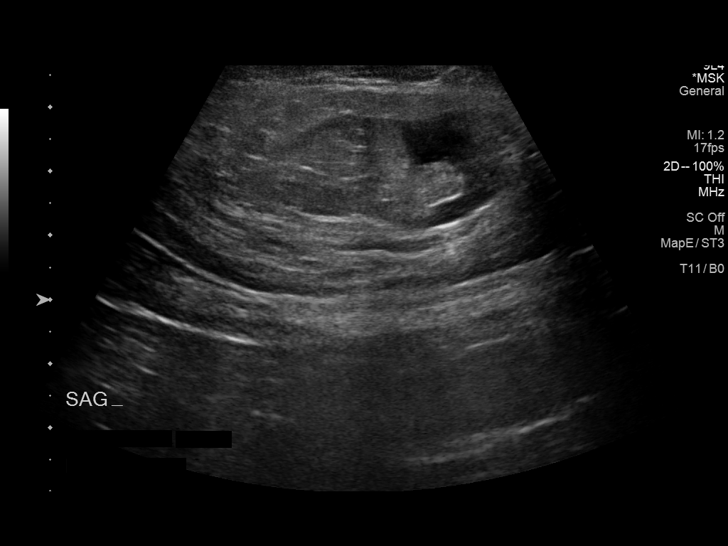
[im 6/17]
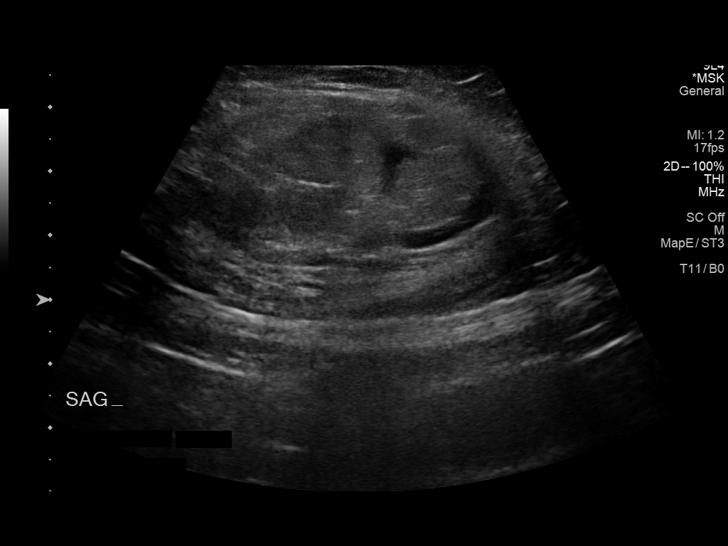
[im 7/17]
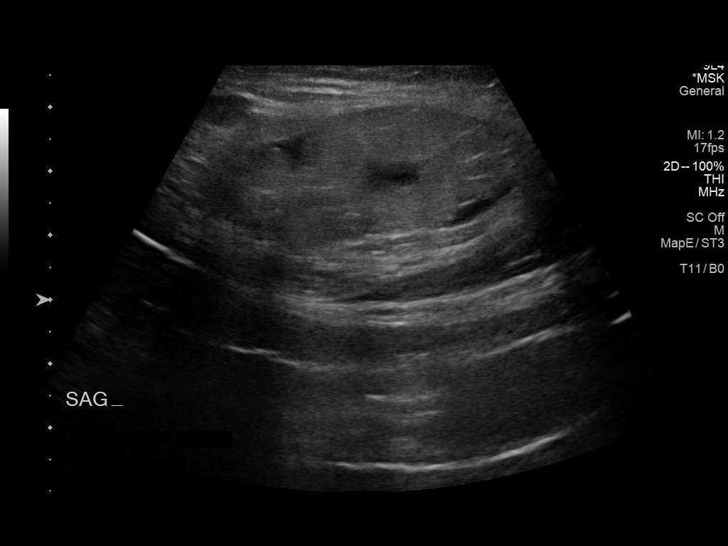
[im 8/17]
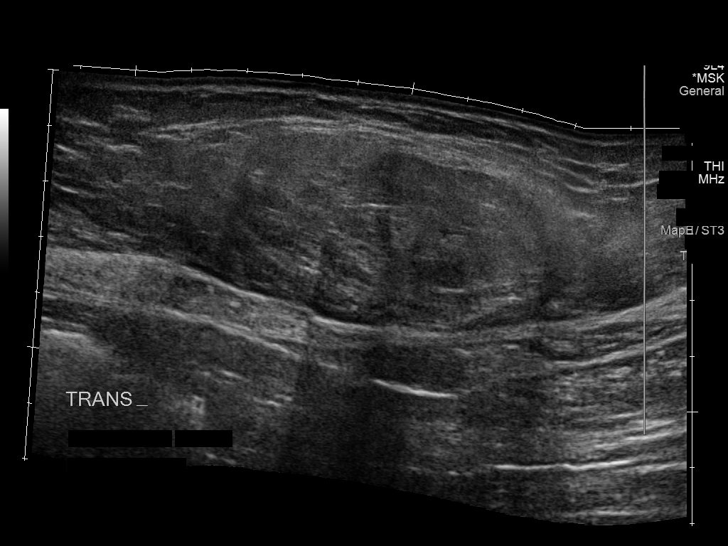
[im 10/17]
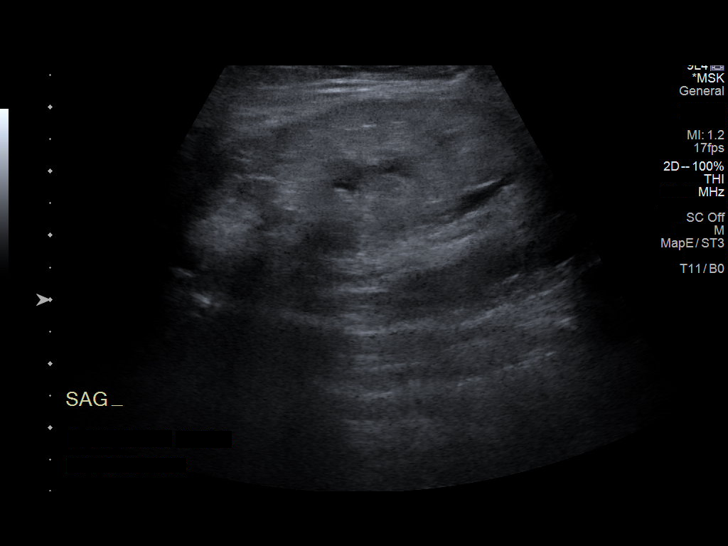
[im 11/17]
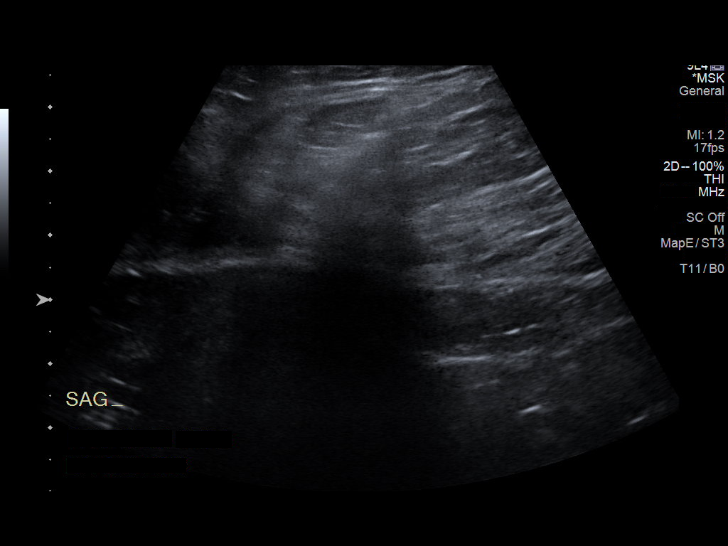
[im 12/17]
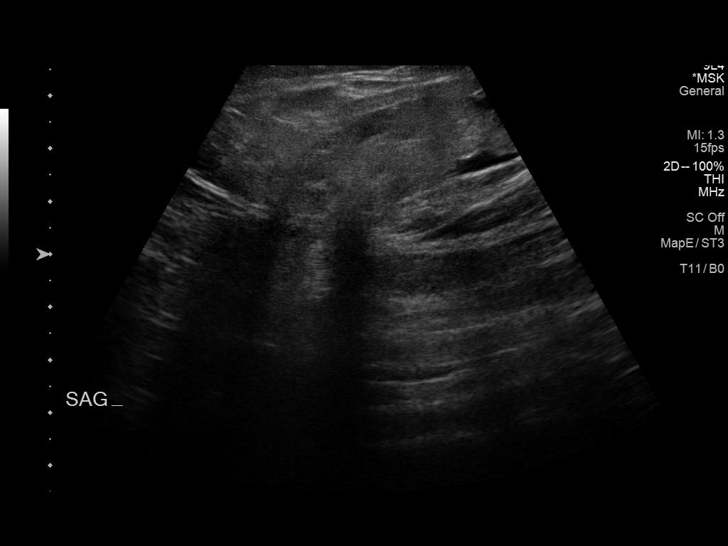
[im 13/17]
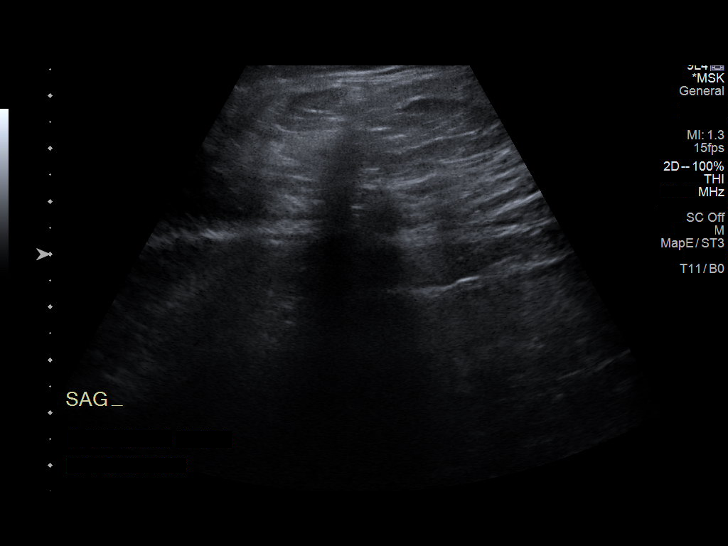
[im 14/17]
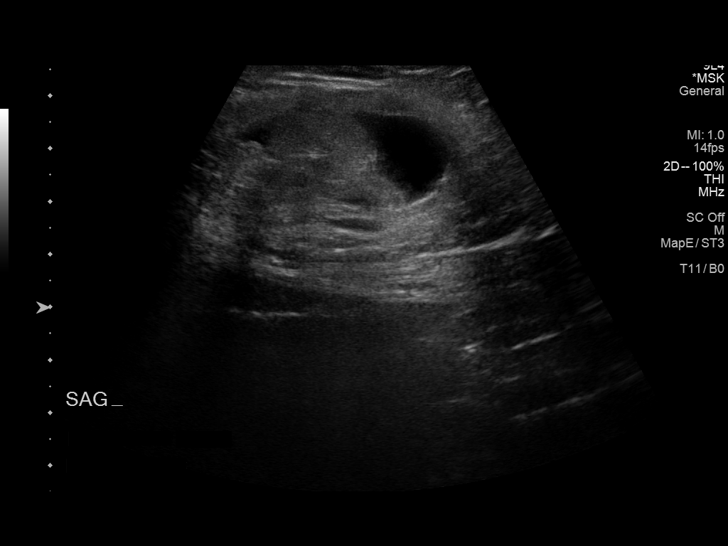
[im 16/17]
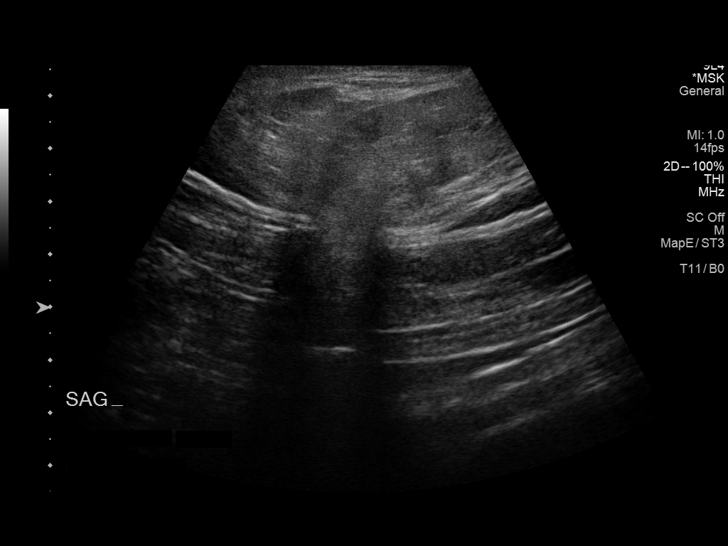
[im 17/17]
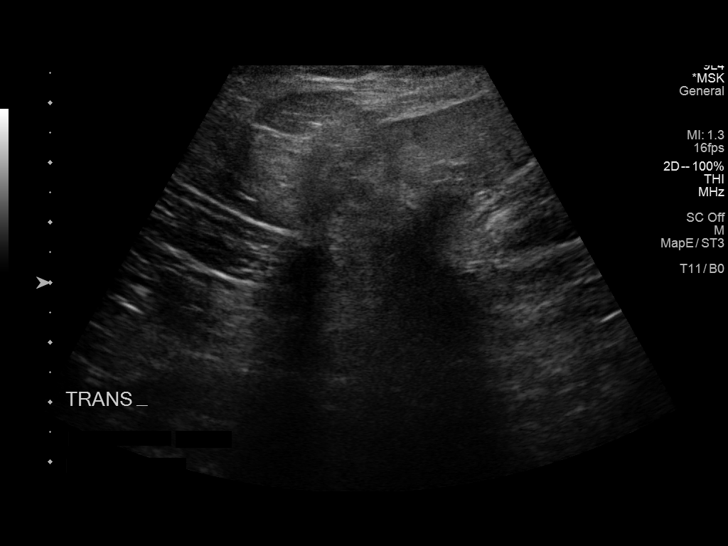

[14 of 17 positions shown; findings below may reference images not displayed]

FINDINGS: A moderate midline ventral hernia is noted containing bowel. No
dilated bowel loops are noted.
IMPRESSION: Moderate midline ventral hernia containing bowel, without evidence
of bowel dilatation. This hernia corresponds to the patient's
palpable abnormality.

## 2021-01-01 ENCOUNTER — Encounter: Payer: 59 | Admitting: Family Medicine

## 2021-02-10 ENCOUNTER — Other Ambulatory Visit: Payer: Self-pay | Admitting: Family Medicine

## 2021-02-21 ENCOUNTER — Encounter: Payer: Self-pay | Admitting: Family Medicine

## 2021-02-21 ENCOUNTER — Ambulatory Visit (INDEPENDENT_AMBULATORY_CARE_PROVIDER_SITE_OTHER): Payer: 59 | Admitting: Family Medicine

## 2021-02-21 ENCOUNTER — Other Ambulatory Visit: Payer: Self-pay

## 2021-02-21 VITALS — BP 131/82 | HR 82 | Temp 97.5°F | Ht 74.0 in | Wt 230.2 lb

## 2021-02-21 DIAGNOSIS — Z6829 Body mass index (BMI) 29.0-29.9, adult: Secondary | ICD-10-CM

## 2021-02-21 DIAGNOSIS — Z125 Encounter for screening for malignant neoplasm of prostate: Secondary | ICD-10-CM

## 2021-02-21 DIAGNOSIS — I10 Essential (primary) hypertension: Secondary | ICD-10-CM

## 2021-02-21 DIAGNOSIS — R739 Hyperglycemia, unspecified: Secondary | ICD-10-CM

## 2021-02-21 DIAGNOSIS — Z0001 Encounter for general adult medical examination with abnormal findings: Secondary | ICD-10-CM | POA: Diagnosis not present

## 2021-02-21 DIAGNOSIS — N529 Male erectile dysfunction, unspecified: Secondary | ICD-10-CM

## 2021-02-21 DIAGNOSIS — Z1211 Encounter for screening for malignant neoplasm of colon: Secondary | ICD-10-CM

## 2021-02-21 DIAGNOSIS — E785 Hyperlipidemia, unspecified: Secondary | ICD-10-CM

## 2021-02-21 DIAGNOSIS — E663 Overweight: Secondary | ICD-10-CM

## 2021-02-21 LAB — LIPID PANEL
Cholesterol: 147 mg/dL (ref 0–200)
HDL: 37.5 mg/dL — ABNORMAL LOW (ref >39.00)
LDL Cholesterol: 74 mg/dL (ref 0–99)
NonHDL: 109.18
Total CHOL/HDL Ratio: 4
Triglycerides: 177 mg/dL — ABNORMAL HIGH (ref 0.0–149.0)
VLDL: 35.4 mg/dL (ref 0.0–40.0)

## 2021-02-21 LAB — COMPREHENSIVE METABOLIC PANEL
ALT: 23 U/L (ref 0–53)
AST: 19 U/L (ref 0–37)
Albumin: 4.3 g/dL (ref 3.5–5.2)
Alkaline Phosphatase: 40 U/L (ref 39–117)
BUN: 17 mg/dL (ref 6–23)
CO2: 27 mEq/L (ref 19–32)
Calcium: 9.6 mg/dL (ref 8.4–10.5)
Chloride: 105 mEq/L (ref 96–112)
Creatinine, Ser: 0.86 mg/dL (ref 0.40–1.50)
GFR: 94.11 mL/min (ref >60.00)
Glucose, Bld: 121 mg/dL — ABNORMAL HIGH (ref 70–99)
Potassium: 4.1 mEq/L (ref 3.5–5.1)
Sodium: 140 mEq/L (ref 135–145)
Total Bilirubin: 0.7 mg/dL (ref 0.2–1.2)
Total Protein: 6.6 g/dL (ref 6.0–8.3)

## 2021-02-21 LAB — CBC
HCT: 42 % (ref 39.0–52.0)
Hemoglobin: 14 g/dL (ref 13.0–17.0)
MCHC: 33.4 g/dL (ref 30.0–36.0)
MCV: 90.4 fl (ref 78.0–100.0)
Platelets: 283 10*3/uL (ref 150.0–400.0)
RBC: 4.65 Mil/uL (ref 4.22–5.81)
RDW: 13.7 % (ref 11.5–15.5)
WBC: 5.2 10*3/uL (ref 4.0–10.5)

## 2021-02-21 LAB — HEMOGLOBIN A1C: Hgb A1c MFr Bld: 5.8 % (ref 4.6–6.5)

## 2021-02-21 LAB — TSH: TSH: 0.71 u[IU]/mL (ref 0.35–5.50)

## 2021-02-21 LAB — PSA: PSA: 2.64 ng/mL (ref 0.10–4.00)

## 2021-02-21 MED ORDER — SILDENAFIL CITRATE 20 MG PO TABS: 20.0000 mg | ORAL_TABLET | Freq: Every day | ORAL | 3 refills | Status: DC | PRN

## 2021-02-21 NOTE — Progress Notes (Signed)
Chief Complaint:  Sean Sean [name] is a 60 y.o. male who presents today for his annual comprehensive physical exam.    Assessment/Plan:  Chronic Problems Addressed Today: No problem-specific Assessment & Plan notes found for this encounter.   There is no height or weight on file to calculate BMI. / Overweight    Preventative Healthcare: Check labs.  Will place referral for colonoscopy.  He will get flu shot at work.  Patient Counseling(The following topics were reviewed and/or handout was given):  -Nutrition: Stressed importance of moderation in sodium/caffeine intake, saturated fat and cholesterol, caloric balance, sufficient intake of fresh fruits, vegetables, and fiber.  -Stressed the importance of regular exercise.   -Substance Abuse: Discussed cessation/primary prevention of tobacco, alcohol, or other drug use; driving or other dangerous activities under the influence; availability of treatment for abuse.   -Injury prevention: Discussed safety belts, safety helmets, smoke detector, smoking near bedding or upholstery.   -Sexuality: Discussed sexually transmitted diseases, partner selection, use of condoms, avoidance of unintended pregnancy and contraceptive alternatives.   -Dental health: Discussed importance of regular tooth brushing, flossing, and dental visits.  -Health maintenance and immunizations reviewed. Please refer to Health maintenance section.  Return to care in 1 year for next preventative visit.     Subjective:  HPI:  He has no acute complaints today.   He mentions having a new job, which recently has decreased in stress level due to a filling of staff positions.   Lifestyle Diet: Reasonably healthy diet; can incorporate more fruit and vegetables, and reduce carbs intake. Exercise: Work gets in the way of more exercise, but does use light weights and an elliptical.  Depression screen PHQ 2/9 12/26/2018  Decreased Interest 0  Down, Depressed, Hopeless 0  PHQ -  2 Score 0    Health Maintenance Due  Topic Date Due   Zoster Vaccines- Shingrix (1 of 2) Never done   COVID-19 Vaccine (3 - Booster for Moderna series) 03/11/2020   COLONOSCOPY (Pts 45-67yrs Insurance coverage will need to be confirmed)  12/08/2020   INFLUENZA VACCINE  Never done     ROS: Per HPI, otherwise a complete review of systems was negative.   PMH:  The following were reviewed and entered/updated in epic: Past Medical History:  Diagnosis Date   Hyperlipidemia    Hypertension    Patient Active Problem List   Diagnosis Date Noted   Hyperglycemia 12/24/2017   Erectile dysfunction 12/23/2017   Postnasal drip 12/23/2017   Chronic right-sided low back Sean [name] without sciatica 12/23/2017   Hypertension 12/25/2016   Hyperlipidemia 12/25/2016   Lipoma 12/25/2016   Chronic Sean [name] of left knee 12/08/2016   Past Surgical History:  Procedure Laterality Date   INSERTION OF MESH N/A 03/17/2018   Procedure: INSERTION OF MESH;  Surgeon: Erroll Luna, MD;  Location: [name];  Service: General;  Laterality: N/A;   KNEE SURGERY     left knee arthroscopy   VENTRAL HERNIA REPAIR N/A 03/17/2018   Procedure: [name];  Surgeon: Erroll Luna, MD;  Location: [name];  Service: General;  Laterality: N/A;    Family History  Problem Relation Age of Onset   Prostate cancer Father    Prostate cancer Paternal Uncle        diagnosed in 2007    Medications- reviewed and updated Current Outpatient Medications  Medication Sig Dispense Refill   atorvastatin (LIPITOR) 20 MG tablet TAKE 1 TABLET BY MOUTH EVERY DAY 90  tablet 3   lisinopril (ZESTRIL) 10 MG tablet TAKE 1 TABLET BY MOUTH EVERY DAY 90 tablet 3   Multiple Vitamin (MULTIVITAMIN) capsule Take 1 capsule by mouth daily.     sildenafil (REVATIO) 20 MG tablet Take 1-5 tablets (20-100 mg total) by mouth daily as needed (erectile dysfunction). 90 tablet 3   No current  facility-administered medications for this visit.    Allergies-reviewed and updated No Known Allergies  Social History   Socioeconomic History   Marital status: Married    Spouse name: Not on file   Number of children: 3   Years of education: Not on file   Highest education level: Not on file  Occupational History   Not on file  Tobacco Use   Smoking status: Never   Smokeless tobacco: Never  Vaping Use   Vaping Use: Never used  Substance and Sexual Activity   Alcohol use: Yes    Comment: Drinks rarely   Drug use: No   Sexual activity: Not on file  Other Topics Concern   Not on file  Social History Narrative   Not on file   Social Determinants of Health   Financial Resource Strain: Not on file  Food Insecurity: Not on file  Transportation Needs: Not on file  Physical Activity: Not on file  Stress: Not on file  Social Connections: Not on file        Objective:  Physical Exam: There were no vitals taken for this visit.  There is no height or weight on file to calculate BMI. Wt Readings from Last 3 Encounters:  01/01/20 223 lb 9.6 oz (101.4 kg)  12/26/18 219 lb (99.3 kg)  11/17/18 221 lb 6.4 oz (100.4 kg)   Gen: NAD, resting comfortably HEENT: TMs normal bilaterally. OP clear. No thyromegaly noted.  CV: RRR with no murmurs appreciated Pulm: NWOB, CTAB with no crackles, wheezes, or rhonchi GI: Normal bowel sounds present. Soft, Nontender, Nondistended. MSK: no edema, cyanosis, or clubbing noted Skin: warm, dry Neuro: CN2-12 grossly intact. Strength 5/5 in upper and lower extremities. Reflexes symmetric and intact bilaterally.  Psych: Normal affect and thought content     I,Sean Sean [name],acting as a scribe for Dimas Chyle, MD.,have documented all relevant documentation on the behalf of Dimas Chyle, MD,as directed by  Dimas Chyle, MD while in the presence of Dimas Chyle, MD.  I, Dimas Chyle, MD, have reviewed all documentation for this visit. The  documentation on 02/21/21 for the exam, diagnosis, procedures, and orders are all accurate and complete.  Sean Sean [name]. Sean Pain, MD 02/21/2021 7:57 AM

## 2021-02-21 NOTE — Assessment & Plan Note (Signed)
At goal on lisinopril 10mg daily. Check labs.  

## 2021-02-21 NOTE — Assessment & Plan Note (Signed)
Check A1c. 

## 2021-02-21 NOTE — Assessment & Plan Note (Signed)
Stable.  Refill sildenafil.  We discussed switching to Cialis as he is not sure if sildenafil is quite as effective as it was before however he decided to continue with current regimen.  He will let me know if he would like to make a switch.

## 2021-02-21 NOTE — Patient Instructions (Signed)
It was very nice to see you today!  We will check blood work today.  I will refer you for colonoscopy.  I will see back in 1 year for your next physical.  Come back sooner if needed.  Take care, Dr Jerline Pain  PLEASE NOTE:  If you had any lab tests please let us know if you have not heard back within a few days. You may see your results on mychart before we have a chance to review them but we will give you a call once they are reviewed by [place]. If we ordered any referrals today, please let us know if you have not heard from their office within the next week.   Please try these tips to maintain a healthy lifestyle:  Eat at least 3 REAL meals and 1-2 snacks per day.  Aim for no more than 5 hours between eating.  If you eat breakfast, please do so within one hour of getting up.   Each meal should contain half fruits/vegetables, one quarter protein, and one quarter carbs (no bigger than a computer mouse)  Cut down on sweet beverages. This includes juice, soda, and sweet tea.   Drink at least 1 glass of water with each meal and aim for at least 8 glasses per day  Exercise at least 150 minutes every week.    Preventive Care 76-63 Years Old, Male Preventive care refers to lifestyle choices and visits with your health care provider that can promote health and wellness. This includes: A yearly physical exam. This is also called an annual wellness visit. Regular dental and eye exams. Immunizations. Screening for certain conditions. Healthy lifestyle choices, such as: Eating a healthy diet. Getting regular exercise. Not using drugs or products that contain nicotine and tobacco. Limiting alcohol use. What can I expect for my preventive care visit? Physical exam Your health care provider will check your: Height and weight. These may be used to calculate your BMI (body mass index). BMI is a measurement that tells if you are at a healthy weight. Heart rate and blood pressure. Body  temperature. Skin for abnormal spots. Counseling Your health care provider may ask you questions about your: Past medical problems. Family's medical history. Alcohol, tobacco, and drug use. Emotional well-being. Home life and relationship well-being. Sexual activity. Diet, exercise, and sleep habits. Work and work Statistician. Access to firearms. What immunizations do I need? Vaccines are usually given at various ages, according to a schedule. Your health care provider will recommend vaccines for you based on your age, medical history, and lifestyle or other factors, such as travel or where you work. What tests do I need? Blood tests Lipid and cholesterol levels. These may be checked every 5 years, or more often if you are over 24 years old. Hepatitis C test. Hepatitis B test. Screening Lung cancer screening. You may have this screening every year starting at age 48 if you have a 30-pack-year history of smoking and currently smoke or have quit within the past 15 years. Prostate cancer screening. Recommendations will vary depending on your family history and other risks. Genital exam to check for testicular cancer or hernias. Colorectal cancer screening. All adults should have this screening starting at age 30 and continuing until age 62. Your health care provider may recommend screening at age 76 if you are at increased risk. You will have tests every 1-10 years, depending on your results and the type of screening test. Diabetes screening. This is done by checking your blood  sugar (glucose) after you have not eaten for a while (fasting). You may have this done every 1-3 years. STD (sexually transmitted disease) testing, if you are at risk. Follow these instructions at home: Eating and drinking  Eat a diet that includes fresh fruits and vegetables, whole grains, lean protein, and low-fat dairy products. Take vitamin and mineral supplements as recommended by your health care  provider. Do not drink alcohol if your health care provider tells you not to drink. If you drink alcohol: Limit how much you have to 0-2 drinks a day. Be aware of how much alcohol is in your drink. In the [place] one drink equals one 12 oz bottle of beer (355 mL), one 5 oz glass of wine (148 mL), or one 1 oz glass of hard liquor (44 mL). Lifestyle Take daily care of your teeth and gums. Brush your teeth every morning and night with fluoride toothpaste. Floss one time each day. Stay active. Exercise for at least 30 minutes 5 or more days each week. Do not use any products that contain nicotine or tobacco, such as cigarettes, e-cigarettes, and chewing tobacco. If you need help quitting, ask your health care provider. Do not use drugs. If you are sexually active, practice safe sex. Use a condom or other form of protection to prevent STIs (sexually transmitted infections). If told by your health care provider, take low-dose aspirin daily starting at age 33. Find healthy ways to cope with stress, such as: Meditation, yoga, or listening to music. Journaling. Talking to a trusted person. Spending time with friends and family. Safety Always wear your seat belt while driving or riding in a vehicle. Do not drive: If you have been drinking alcohol. Do not ride with someone who has been drinking. When you are tired or distracted. While texting. Wear a helmet and other protective equipment during sports activities. If you have firearms in your house, make sure you follow all gun safety procedures. What's next? Go to your health care provider once a year for an annual wellness visit. Ask your health care provider how often you should have your eyes and teeth checked. Stay up to date on all vaccines. This information is not intended to replace advice given to you by your health care provider. Make sure you discuss any questions you have with your health care provider. Document Revised: 07/05/2020  Document Reviewed: 04/21/2018 Elsevier Patient Education  2022 [employer].

## 2021-02-21 NOTE — Assessment & Plan Note (Signed)
On Lipitor 20 mg daily.  Check labs.

## 2021-02-24 NOTE — Progress Notes (Signed)
Please inform patient of the following:  His blood sguar and cholesterol are borderline but stable. Everything else is NORMAL.  He should continue working on diet and exercise. WE can recheck in a year.  Algis Greenhouse. Jerline Pain, MD 02/24/2021 3:35 PM

## 2021-04-18 ENCOUNTER — Encounter: Payer: Self-pay | Admitting: Gastroenterology

## 2021-06-09 ENCOUNTER — Ambulatory Visit (AMBULATORY_SURGERY_CENTER): Payer: 59 | Admitting: *Deleted

## 2021-06-09 ENCOUNTER — Other Ambulatory Visit: Payer: Self-pay

## 2021-06-09 VITALS — Ht 74.0 in | Wt 226.0 lb

## 2021-06-09 DIAGNOSIS — Z1211 Encounter for screening for malignant neoplasm of colon: Secondary | ICD-10-CM

## 2021-06-09 MED ORDER — PLENVU 140 G PO SOLR
1.0000 | Freq: Once | ORAL | 0 refills | Status: AC
Start: 2021-06-09 — End: 2021-06-09

## 2021-06-09 NOTE — Progress Notes (Signed)
No egg or soy allergy known to patient  No issues known to pt with past sedation with any surgeries or procedures Patient denies ever being told they had issues or difficulty with intubation  No FH of Malignant Hyperthermia Pt is not on diet pills Pt is not on  home 02  Pt is not on blood thinners  Pt denies issues with constipation  No A fib or A flutter  Pt is  vaccinated  for Covid   plenvu Coupon to pt in PV today , Code to Pharmacy and  NO PA's for preps discussed with pt In PV today  Discussed with pt there will be an out-of-pocket cost for prep and that varies from $0 to 70 +  dollars - pt verbalized understanding   Due to the COVID-19 pandemic we are asking patients to follow certain guidelines in PV and the [facility]   Pt aware of COVID protocols and [facility] guidelines   PV completed over the phone. Pt verified name, DOB, address and insurance during PV today.  Pt mailed instruction packet with copy of consent form to read and not return, and instructions.  Pt encouraged to call with questions or issues.  If pt has My chart, procedure instructions sent via My Chart

## 2021-06-18 ENCOUNTER — Telehealth: Payer: Self-pay | Admitting: Gastroenterology

## 2021-06-18 NOTE — Telephone Encounter (Signed)
Called and spoke with patient. He stated that he tested positive for COVID yesterday and is currently in quarantine. He states that he has "head cold" symptoms. I told him that it would be best to reschedule so that he has time to recover. He requested a Monday appt. His colonoscopy has been rescheduled to Monday, 07/28/21 at 3:30 pm. Pt is aware that he will need to arrive at 2:30 pm with a care partner. I told pt that I will send new instructions to him via my chart and I will place a copy in the mail for him. Pt verbalized understanding and had no concerns at the end of call.

## 2021-06-18 NOTE — Telephone Encounter (Signed)
Inbound call from patient, states that he has a procedure on Monday with Dr. Loletha Carrow for a Colonoscopy. Patient tested positive for COVID yesterday and is seeking advice on if he can keep appointment or needs to reschedule. Please advise.

## 2021-06-23 ENCOUNTER — Encounter: Payer: 59 | Admitting: Gastroenterology

## 2021-07-24 ENCOUNTER — Encounter: Payer: Self-pay | Admitting: Gastroenterology

## 2021-07-28 ENCOUNTER — Ambulatory Visit (AMBULATORY_SURGERY_CENTER): Payer: 59 | Admitting: Gastroenterology

## 2021-07-28 ENCOUNTER — Other Ambulatory Visit: Payer: Self-pay

## 2021-07-28 ENCOUNTER — Encounter: Payer: Self-pay | Admitting: Gastroenterology

## 2021-07-28 VITALS — BP 119/83 | HR 68 | Temp 98.3°F | Resp 11 | Ht 74.0 in | Wt 226.0 lb

## 2021-07-28 DIAGNOSIS — Z1211 Encounter for screening for malignant neoplasm of colon: Secondary | ICD-10-CM | POA: Diagnosis present

## 2021-07-28 MED ORDER — SODIUM CHLORIDE 0.9 % IV SOLN: 500.0000 mL | INTRAVENOUS | Status: DC

## 2021-07-28 NOTE — Op Note (Addendum)
[facility] ?Patient Name: Sean [name] ?Procedure Date: 07/28/2021 3:41 PM ?MRN: [id number] ?Endoscopist: Estill Cotta. Loletha Carrow , MD ?Age: 61 ?Referring MD:  ?Date of Birth: [birth year] ?Gender: Male ?Account #: [id number] ?Procedure:                Colonoscopy ?Indications:              Screening for colorectal malignant neoplasm ?Medicines:                Monitored Anesthesia Care ?Procedure:                Pre-Anesthesia Assessment: ?                          - Prior to the procedure, a History and Physical  ?                          was performed, and patient medications and  ?                          allergies were reviewed. The patient's tolerance of  ?                          previous anesthesia was also reviewed. The risks  ?                          and benefits of the procedure and the sedation  ?                          options and risks were discussed with the patient.  ?                          All questions were answered, and informed consent  ?                          was obtained. Prior Anticoagulants: The patient has  ?                          taken no previous anticoagulant or antiplatelet  ?                          agents. ASA Grade Assessment: II - A patient with  ?                          mild systemic disease. After reviewing the risks  ?                          and benefits, the patient was deemed in  ?                          satisfactory condition to undergo the procedure. ?                          After obtaining informed consent, the colonoscope  ?  was passed under direct vision. Throughout the  ?                          procedure, the patient's blood pressure, pulse, and  ?                          oxygen saturations were monitored continuously. The  ?                          Colonoscope was introduced through the anus and  ?                          advanced to the the cecum, identified by  ?                          appendiceal orifice and  ileocecal valve. The  ?                          colonoscopy was performed without difficulty. The  ?                          patient tolerated the procedure well. The quality  ?                          of the bowel preparation was excellent. The  ?                          ileocecal valve, appendiceal orifice, and rectum  ?                          were photographed. The bowel preparation used was  ?                          Plenvu. ?Scope In: 3:47:40 PM ?Scope Out: 3:58:36 PM ?Scope Withdrawal Time: 0 hours 8 minutes 24 seconds  ?Total Procedure Duration: 0 hours 10 minutes 56 seconds  ?Findings:                 The perianal and digital rectal examinations were  ?                          normal. ?                          Repeat examination of right colon under NBI  ?                          performed. ?                          A single small angioectasia was found in the cecum. ?                          Multiple diverticula were found from mid transverse  ?  colon to sigmoid colon. ?                          The exam was otherwise without abnormality on  ?                          direct and retroflexion views. ?Complications:            No immediate complications. ?Estimated Blood Loss:     Estimated blood loss: none. ?Impression:               - A single colonic angioectasia. ?                          - Diverticulosis from transverse colon to sigmoid  ?                          colon. ?                          - The examination was otherwise normal on direct  ?                          and retroflexion views. ?                          - No specimens collected. ?Recommendation:           - Patient has a contact number available for  ?                          emergencies. The signs and symptoms of potential  ?                          delayed complications were discussed with the  ?                          patient. Return to normal activities tomorrow.  ?                           Written discharge instructions were provided to the  ?                          patient. ?                          - Resume previous diet. ?                          - Continue present medications. ?                          - Repeat colonoscopy in 10 years for screening  ?                          purposes. ?Keith Felten L. Loletha Carrow, MD ?07/28/2021 4:04:37 PM ?This report has been signed electronically. ?

## 2021-07-28 NOTE — Progress Notes (Signed)
History and Physical: ? This patient presents for endoscopic testing for: ?Encounter Diagnosis  ?Name Primary?  ? Special screening for malignant neoplasms, colon Yes  ? ? ?Last colonoscopy 2-12 in [place] ?Patient denies chronic abdominal pain, rectal bleeding, constipation or diarrhea. ? ? ?ROS: ?Patient denies chest pain or shortness of breath ? ? ?Past Medical History: ?Past Medical History:  ?Diagnosis Date  ? Allergy   ? seasonal  ? GERD (gastroesophageal reflux disease)   ? occasional  ? Hyperlipidemia   ? Hypertension   ? Sleep apnea   ? ? ? ?Past Surgical History: ?Past Surgical History:  ?Procedure Laterality Date  ? INSERTION OF MESH N/A 03/17/2018  ? Procedure: INSERTION OF MESH;  Surgeon: Erroll Luna, MD;  Location: [facility];  Service: General;  Laterality: N/A;  ? KNEE SURGERY    ? left knee arthroscopy  ? VENTRAL HERNIA REPAIR N/A 03/17/2018  ? Procedure: REPAIR VENTRAL HERNIA WITH MESH;  Surgeon: Erroll Luna, MD;  Location: [facility];  Service: General;  Laterality: N/A;  ? ? ?Allergies: ?No Known Allergies ? ?Outpatient Meds: ?Current Outpatient Medications  ?Medication Sig Dispense Refill  ? Ascorbic Acid (VITAMIN C PO) Take 500 mg by mouth daily.    ? atorvastatin (LIPITOR) 20 MG tablet TAKE 1 TABLET BY MOUTH EVERY DAY 90 tablet 3  ? lisinopril (ZESTRIL) 10 MG tablet TAKE 1 TABLET BY MOUTH EVERY DAY 90 tablet 3  ? Multiple Vitamin (MULTIVITAMIN) capsule Take 1 capsule by mouth daily.    ? sildenafil (REVATIO) 20 MG tablet Take 1-5 tablets (20-100 mg total) by mouth daily as needed (erectile dysfunction). 90 tablet 3  ? ?Current Facility-Administered Medications  ?Medication Dose Route Frequency Provider Last Rate Last Admin  ? 0.9 %  sodium chloride infusion  500 mL Intravenous Continuous Danis, Kirke Corin, MD      ? ? ? ? ?___________________________________________________________________ ?Objective  ? ?Exam: ? ?BP (!) 143/102   Pulse (!) 101   Temp  98.3 ?F (36.8 ?C) (Temporal)   Ht '6\' 2"'$  (1.88 m)   Wt 226 lb (102.5 kg)   SpO2 97%   BMI 29.02 kg/m?  ? ?CV: RRR without murmur, S1/S2 ?Resp: clear to auscultation bilaterally, normal RR and effort noted ?GI: soft, no tenderness, with active bowel sounds. ? ? ?Assessment: ?Encounter Diagnosis  ?Name Primary?  ? Special screening for malignant neoplasms, colon Yes  ? ? ? ?Plan: ?Colonoscopy ? The benefits and risks of the planned procedure were described in detail with the patient or (when appropriate) their health care proxy.  Risks were outlined as including, but not limited to, bleeding, infection, perforation, adverse medication reaction leading to cardiac or pulmonary decompensation, pancreatitis (if ERCP).  The limitation of incomplete mucosal visualization was also discussed.  No guarantees or warranties were given. ? ? ? ?The patient is appropriate for an endoscopic procedure in the ambulatory setting. ? ? - Wilfrid Lund, MD ? ? ? ? ?

## 2021-07-28 NOTE — Progress Notes (Signed)
Pt's states no medical or surgical changes since previsit or office visit. 

## 2021-07-28 NOTE — Progress Notes (Signed)
VSS, transported to PACU °

## 2021-07-28 NOTE — Patient Instructions (Signed)
Resume previous diet and continue present medications.  Repeat colonoscopy in 10 years for screening purposes. ? ?YOU HAD AN ENDOSCOPIC PROCEDURE TODAY AT [facility]:   Refer to the procedure report that was given to you for any specific questions about what was found during the examination.  If the procedure report does not answer your questions, please call your gastroenterologist to clarify.  If you requested that your care partner not be given the details of your procedure findings, then the procedure report has been included in a sealed envelope for you to review at your convenience later. ? ?YOU SHOULD EXPECT: Some feelings of bloating in the abdomen. Passage of more gas than usual.  Walking can help get rid of the air that was put into your GI tract during the procedure and reduce the bloating. If you had a lower endoscopy (such as a colonoscopy or flexible sigmoidoscopy) you may notice spotting of blood in your stool or on the toilet paper. If you underwent a bowel prep for your procedure, you may not have a normal bowel movement for a few days. ? ?Please Note:  You might notice some irritation and congestion in your nose or some drainage.  This is from the oxygen used during your procedure.  There is no need for concern and it should clear up in a day or so. ? ?SYMPTOMS TO REPORT IMMEDIATELY: ? ?Following lower endoscopy (colonoscopy or flexible sigmoidoscopy): ? Excessive amounts of blood in the stool ? Significant tenderness or worsening of abdominal pains ? Swelling of the abdomen that is new, acute ? Fever of 100?F or higher ? ? ?For urgent or emergent issues, a gastroenterologist can be reached at any hour by calling [phone]. ?Do not use MyChart messaging for urgent concerns.  ? ? ?DIET:  We do recommend a small meal at first, but then you may proceed to your regular diet.  Drink plenty of fluids but you should avoid alcoholic beverages for 24 hours. ? ?ACTIVITY:  You should  plan to take it easy for the rest of today and you should NOT DRIVE or use heavy machinery until tomorrow (because of the sedation medicines used during the test).   ? ?FOLLOW UP: ?Our staff will call the number listed on your records 48-72 hours following your procedure to check on you and address any questions or concerns that you may have regarding the information given to you following your procedure. If we do not reach you, we will leave a message.  We will attempt to reach you two times.  During this call, we will ask if you have developed any symptoms of COVID 19. If you develop any symptoms (ie: fever, flu-like symptoms, shortness of breath, cough etc.) before then, please call [phone].  If you test positive for Covid 19 in the 2 weeks post procedure, please call and report this information to [place].   ? ?If any biopsies were taken you will be contacted by phone or by letter within the next 1-3 weeks.  Please call us at [phone] if you have not heard about the biopsies in 3 weeks.  ? ? ?SIGNATURES/CONFIDENTIALITY: ?You and/or your care partner have signed paperwork which will be entered into your electronic medical record.  These signatures attest to the fact that that the information above on your After Visit Summary has been reviewed and is understood.  Full responsibility of the confidentiality of this discharge information lies with you and/or your care-partner.  ?

## 2021-07-30 ENCOUNTER — Telehealth: Payer: Self-pay

## 2021-07-30 NOTE — Telephone Encounter (Signed)
?  Follow up Call- ? ?Call back number 07/28/2021  ?Post procedure Call Back phone  # [phone]  ?Permission to leave phone message Yes  ?Some recent data might be hidden  ?  ? ?Patient questions: ? ?Do you have a fever, pain , or abdominal swelling? No. ?Pain Score  0 * ? ?Have you tolerated food without any problems? Yes.   ? ?Have you been able to return to your normal activities? Yes.   ? ?Do you have any questions about your discharge instructions: ?Diet   No. ?Medications  No. ?Follow up visit  No. ? ?Do you have questions or concerns about your Care? No. ? ?Actions: ?* If pain score is 4 or above: ?No action needed, pain <4. ? ?Have you developed a fever since your procedure? no ? ?2.   Have you had an respiratory symptoms (SOB or cough) since your procedure? no ? ?3.   Have you tested positive for COVID 19 since your procedure no ? ?4.   Have you had any family members/close contacts diagnosed with the COVID 19 since your procedure?  no ? ? ?If yes to any of these questions please route to Joylene John, RN and Joella Prince, RN ? ? ? ?

## 2022-02-02 ENCOUNTER — Encounter: Payer: Self-pay | Admitting: *Deleted

## 2022-02-16 ENCOUNTER — Other Ambulatory Visit: Payer: Self-pay | Admitting: Family Medicine

## 2022-02-25 ENCOUNTER — Encounter: Payer: Self-pay | Admitting: Family Medicine

## 2022-02-25 ENCOUNTER — Ambulatory Visit (INDEPENDENT_AMBULATORY_CARE_PROVIDER_SITE_OTHER): Payer: 59 | Admitting: Family Medicine

## 2022-02-25 VITALS — BP 134/79 | HR 65 | Temp 97.3°F | Ht 74.0 in | Wt 225.4 lb

## 2022-02-25 DIAGNOSIS — Z125 Encounter for screening for malignant neoplasm of prostate: Secondary | ICD-10-CM

## 2022-02-25 DIAGNOSIS — E785 Hyperlipidemia, unspecified: Secondary | ICD-10-CM

## 2022-02-25 DIAGNOSIS — I10 Essential (primary) hypertension: Secondary | ICD-10-CM | POA: Diagnosis not present

## 2022-02-25 DIAGNOSIS — Z0001 Encounter for general adult medical examination with abnormal findings: Secondary | ICD-10-CM

## 2022-02-25 DIAGNOSIS — N529 Male erectile dysfunction, unspecified: Secondary | ICD-10-CM

## 2022-02-25 DIAGNOSIS — Z23 Encounter for immunization: Secondary | ICD-10-CM | POA: Diagnosis not present

## 2022-02-25 DIAGNOSIS — R739 Hyperglycemia, unspecified: Secondary | ICD-10-CM

## 2022-02-25 LAB — TSH: TSH: 1.01 u[IU]/mL (ref 0.35–5.50)

## 2022-02-25 LAB — COMPREHENSIVE METABOLIC PANEL
ALT: 18 U/L (ref 0–53)
AST: 14 U/L (ref 0–37)
Albumin: 4.4 g/dL (ref 3.5–5.2)
Alkaline Phosphatase: 43 U/L (ref 39–117)
BUN: 15 mg/dL (ref 6–23)
CO2: 29 mEq/L (ref 19–32)
Calcium: 9.3 mg/dL (ref 8.4–10.5)
Chloride: 105 mEq/L (ref 96–112)
Creatinine, Ser: 0.84 mg/dL (ref 0.40–1.50)
GFR: 94.11 mL/min (ref >60.00)
Glucose, Bld: 105 mg/dL — ABNORMAL HIGH (ref 70–99)
Potassium: 4.2 mEq/L (ref 3.5–5.1)
Sodium: 140 mEq/L (ref 135–145)
Total Bilirubin: 0.7 mg/dL (ref 0.2–1.2)
Total Protein: 6.9 g/dL (ref 6.0–8.3)

## 2022-02-25 LAB — CBC
HCT: 43.1 % (ref 39.0–52.0)
Hemoglobin: 14.4 g/dL (ref 13.0–17.0)
MCHC: 33.3 g/dL (ref 30.0–36.0)
MCV: 89.5 fl (ref 78.0–100.0)
Platelets: 287 10*3/uL (ref 150.0–400.0)
RBC: 4.82 Mil/uL (ref 4.22–5.81)
RDW: 13.5 % (ref 11.5–15.5)
WBC: 5.2 10*3/uL (ref 4.0–10.5)

## 2022-02-25 LAB — LIPID PANEL
Cholesterol: 144 mg/dL (ref 0–200)
HDL: 38.5 mg/dL — ABNORMAL LOW (ref >39.00)
LDL Cholesterol: 87 mg/dL (ref 0–99)
NonHDL: 105.27
Total CHOL/HDL Ratio: 4
Triglycerides: 91 mg/dL (ref 0.0–149.0)
VLDL: 18.2 mg/dL (ref 0.0–40.0)

## 2022-02-25 LAB — PSA: PSA: 3.03 ng/mL (ref 0.10–4.00)

## 2022-02-25 LAB — HEMOGLOBIN A1C: Hgb A1c MFr Bld: 6.1 % (ref 4.6–6.5)

## 2022-02-25 NOTE — Assessment & Plan Note (Signed)
Stable on sildenafil as needed. 

## 2022-02-25 NOTE — Progress Notes (Signed)
Chief Complaint:  Sean [name] is a 61 y.o. male who presents today for his annual comprehensive physical exam.    Assessment/Plan:  Chronic Problems Addressed Today: Hyperglycemia Check A1c.  Hyperlipidemia On Lipitor 20 mg daily.  Check labs.  Discussed lifestyle modifications.  Hypertension At goal today on lisinopril 10 mg daily.  Check labs.  Erectile dysfunction Stable on sildenafil as needed.  Preventative Healthcare: Shingles vaccine given today.  Check labs.  Had colonoscopy earlier this year.  Due again in 10 years.  Up-to-date on otherVaccines and screenings.  Patient Counseling(The following topics were reviewed and/or handout was given):  -Nutrition: Stressed importance of moderation in sodium/caffeine intake, saturated fat and cholesterol, caloric balance, sufficient intake of fresh fruits, vegetables, and fiber.  -Stressed the importance of regular exercise.   -Substance Abuse: Discussed cessation/primary prevention of tobacco, alcohol, or other drug use; driving or other dangerous activities under the influence; availability of treatment for abuse.   -Injury prevention: Discussed safety belts, safety helmets, smoke detector, smoking near bedding or upholstery.   -Sexuality: Discussed sexually transmitted diseases, partner selection, use of condoms, avoidance of unintended pregnancy and contraceptive alternatives.   -Dental health: Discussed importance of regular tooth brushing, flossing, and dental visits.  -Health maintenance and immunizations reviewed. Please refer to Health maintenance section.  Return to care in 1 year for next preventative visit.     Subjective:  HPI:  He has no acute complaints today.   Lifestyle Diet: Balanced. Plenty of fruits and vegetbales.  Exercise: None specific.      02/25/2022    8:16 AM  Depression screen PHQ 2/9  Decreased Interest 0  Down, Depressed, Hopeless 0  PHQ - 2 Score 0    Health Maintenance Due  Topic  Date Due   Zoster Vaccines- Shingrix (1 of 2) Never done     ROS: Per HPI, otherwise a complete review of systems was negative.   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;  Service: General;  Laterality: N/A;   KNEE SURGERY     left knee arthroscopy   VENTRAL HERNIA REPAIR N/A 03/17/2018   Procedure: REPAIR VENTRAL HERNIA WITH MESH;  Surgeon: Erroll Luna, MD;  [facility];  Service: General;  Laterality: N/A;    Family History  Problem Relation Age of Onset   Prostate cancer Father    Prostate cancer Paternal Uncle        diagnosed in 2007   Colon cancer Neg Hx    Colon polyps Neg Hx    Esophageal cancer Neg Hx    Stomach cancer Neg Hx    Rectal cancer Neg Hx     Medications- reviewed and updated Current Outpatient Medications  Medication Sig Dispense Refill  Ascorbic Acid (VITAMIN C PO) Take 500 mg by mouth daily.     atorvastatin (LIPITOR) 20 MG tablet TAKE 1 TABLET BY MOUTH EVERY DAY 90 tablet 3   lisinopril (ZESTRIL) 10 MG tablet TAKE 1 TABLET BY MOUTH EVERY DAY 90 tablet 3   Multiple Vitamin (MULTIVITAMIN) capsule Take 1 capsule by mouth daily.     sildenafil (REVATIO) 20 MG tablet Take 1-5 tablets (20-100 mg total) by mouth daily as needed  (erectile dysfunction). 90 tablet 3   No current facility-administered medications for this visit.    Allergies-reviewed and updated No Known Allergies  Social History   Socioeconomic History   Marital status: Married    Spouse name: Not on file   Number of children: 3   Years of education: Not on file   Highest education level: Not on file  Occupational History   Not on file  Tobacco Use   Smoking status: Never   Smokeless tobacco: Never  Vaping Use   Vaping Use: Never used  Substance and Sexual Activity   Alcohol use: Yes    Comment: Drinks rarely   Drug use: No   Sexual activity: Not on file  Other Topics Concern   Not on file  Social History Narrative   Not on file   Social Determinants of Health   Financial Resource Strain: Not on file  Food Insecurity: Not on file  Transportation Needs: Not on file  Physical Activity: Not on file  Stress: Not on file  Social Connections: Not on file        Objective:  Physical Exam: BP 134/79   Pulse 65   Temp (!) 97.3 F (36.3 C) (Temporal)   Ht '6\' 2"'$  (1.88 m)   Wt 225 lb 6.4 oz (102.2 kg)   SpO2 96%   BMI 28.94 kg/m   Body mass index is 28.94 kg/m. Wt Readings from Last 3 Encounters:  02/25/22 225 lb 6.4 oz (102.2 kg)  07/28/21 226 lb (102.5 kg)  06/09/21 226 lb (102.5 kg)   Gen: NAD, resting comfortably HEENT: TMs normal bilaterally. OP clear. No thyromegaly noted.  CV: RRR with no murmurs appreciated Pulm: NWOB, CTAB with no crackles, wheezes, or rhonchi GI: Normal bowel sounds present. Soft, Nontender, Nondistended. MSK: no edema, cyanosis, or clubbing noted Skin: warm, dry Neuro: CN2-12 grossly intact. Strength 5/5 in upper and lower extremities. Reflexes symmetric and intact bilaterally.  Psych: Normal affect and thought content     Sean Patricelli M. Jerline Pain, MD 02/25/2022 8:47 AM

## 2022-02-25 NOTE — Assessment & Plan Note (Signed)
Check A1c. 

## 2022-02-25 NOTE — Addendum Note (Signed)
Addended by: Betti Cruz on: 02/25/2022 01:45 PM   Modules accepted: Orders

## 2022-02-25 NOTE — Assessment & Plan Note (Signed)
At goal today on lisinopril 10 mg daily.  Check labs.

## 2022-02-25 NOTE — Patient Instructions (Signed)
It was very nice to see you today!  We will give your shingles vaccine today.  Please continue to work on diet and exercise.  We will check blood work today.  I will see back in year for your next physical.  Come back sooner if needed.  Take care, Dr Jerline Pain  PLEASE NOTE:  If you had any lab tests please let us know if you have not heard back within a few days. You may see your results on mychart before we have a chance to review them but we will give you a call once they are reviewed by [place]. If we ordered any referrals today, please let us know if you have not heard from their office within the next week.   Please try these tips to maintain a healthy lifestyle:  Eat at least 3 REAL meals and 1-2 snacks per day.  Aim for no more than 5 hours between eating.  If you eat breakfast, please do so within one hour of getting up.   Each meal should contain half fruits/vegetables, one quarter protein, and one quarter carbs (no bigger than a computer mouse)  Cut down on sweet beverages. This includes juice, soda, and sweet tea.   Drink at least 1 glass of water with each meal and aim for at least 8 glasses per day  Exercise at least 150 minutes every week.    Preventive Care 49-39 Years Old, Male Preventive care refers to lifestyle choices and visits with your health care provider that can promote health and wellness. Preventive care visits are also called wellness exams. What can I expect for my preventive care visit? Counseling During your preventive care visit, your health care provider may ask about your: Medical history, including: Past medical problems. Family medical history. Current health, including: Emotional well-being. Home life and relationship well-being. Sexual activity. Lifestyle, including: Alcohol, nicotine or tobacco, and drug use. Access to firearms. Diet, exercise, and sleep habits. Safety issues such as seatbelt and bike helmet use. Sunscreen use. Work and  work Statistician. Physical exam Your health care provider will check your: Height and weight. These may be used to calculate your BMI (body mass index). BMI is a measurement that tells if you are at a healthy weight. Waist circumference. This measures the distance around your waistline. This measurement also tells if you are at a healthy weight and may help predict your risk of certain diseases, such as type 2 diabetes and high blood pressure. Heart rate and blood pressure. Body temperature. Skin for abnormal spots. What immunizations do I need?  Vaccines are usually given at various ages, according to a schedule. Your health care provider will recommend vaccines for you based on your age, medical history, and lifestyle or other factors, such as travel or where you work. What tests do I need? Screening Your health care provider may recommend screening tests for certain conditions. This may include: Lipid and cholesterol levels. Diabetes screening. This is done by checking your blood sugar (glucose) after you have not eaten for a while (fasting). Hepatitis B test. Hepatitis C test. HIV (human immunodeficiency virus) test. STI (sexually transmitted infection) testing, if you are at risk. Lung cancer screening. Prostate cancer screening. Colorectal cancer screening. Talk with your health care provider about your test results, treatment options, and if necessary, the need for more tests. Follow these instructions at home: Eating and drinking  Eat a diet that includes fresh fruits and vegetables, whole grains, lean protein, and low-fat dairy products.  Take vitamin and mineral supplements as recommended by your health care provider. Do not drink alcohol if your health care provider tells you not to drink. If you drink alcohol: Limit how much you have to 0-2 drinks a day. Know how much alcohol is in your drink. In the [place] one drink equals one 12 oz bottle of beer (355 mL), one 5 oz glass  of wine (148 mL), or one 1 oz glass of hard liquor (44 mL). Lifestyle Brush your teeth every morning and night with fluoride toothpaste. Floss one time each day. Exercise for at least 30 minutes 5 or more days each week. Do not use any products that contain nicotine or tobacco. These products include cigarettes, chewing tobacco, and vaping devices, such as e-cigarettes. If you need help quitting, ask your health care provider. Do not use drugs. If you are sexually active, practice safe sex. Use a condom or other form of protection to prevent STIs. Take aspirin only as told by your health care provider. Make sure that you understand how much to take and what form to take. Work with your health care provider to find out whether it is safe and beneficial for you to take aspirin daily. Find healthy ways to manage stress, such as: Meditation, yoga, or listening to music. Journaling. Talking to a trusted person. Spending time with friends and family. Minimize exposure to UV radiation to reduce your risk of skin cancer. Safety Always wear your seat belt while driving or riding in a vehicle. Do not drive: If you have been drinking alcohol. Do not ride with someone who has been drinking. When you are tired or distracted. While texting. If you have been using any mind-altering substances or drugs. Wear a helmet and other protective equipment during sports activities. If you have firearms in your house, make sure you follow all gun safety procedures. What's next? Go to your health care provider once a year for an annual wellness visit. Ask your health care provider how often you should have your eyes and teeth checked. Stay up to date on all vaccines. This information is not intended to replace advice given to you by your health care provider. Make sure you discuss any questions you have with your health care provider. Document Revised: 10/23/2020 Document Reviewed: 10/23/2020 Elsevier Patient  Education  [employer].

## 2022-02-25 NOTE — Assessment & Plan Note (Signed)
On Lipitor 20 mg daily.  Check labs.  Discussed lifestyle modifications.

## 2022-02-27 NOTE — Progress Notes (Signed)
Please inform patient of the following:  Labs are all stable compared to last year. Do not need to make any changes to his treatment plan at this time. HE should continue to work on diet and exercise and we can recheck in a year.  Sean [name]. Jerline Pain, MD 02/27/2022 1:42 PM

## 2022-03-07 ENCOUNTER — Other Ambulatory Visit: Payer: Self-pay | Admitting: Family Medicine

## 2022-05-26 ENCOUNTER — Ambulatory Visit (INDEPENDENT_AMBULATORY_CARE_PROVIDER_SITE_OTHER): Payer: 59

## 2022-05-26 DIAGNOSIS — Z23 Encounter for immunization: Secondary | ICD-10-CM | POA: Diagnosis not present

## 2022-05-26 NOTE — Progress Notes (Signed)
Pt in office for second Shingles vaccine. Pt states first vaccine had small reaction. Felt mild flu symptoms. Advised patient to expect same symptoms with this vaccine but should subside within 24 hours. Administered in R deltoid and pt tolerated well.

## 2022-12-10 ENCOUNTER — Other Ambulatory Visit: Payer: Self-pay | Admitting: Family Medicine

## 2023-03-01 ENCOUNTER — Encounter: Payer: Self-pay | Admitting: Family Medicine

## 2023-03-01 ENCOUNTER — Ambulatory Visit: Payer: 59 | Admitting: Family Medicine

## 2023-03-01 VITALS — BP 124/77 | HR 67 | Temp 98.0°F | Ht 74.0 in | Wt 218.4 lb

## 2023-03-01 DIAGNOSIS — N529 Male erectile dysfunction, unspecified: Secondary | ICD-10-CM

## 2023-03-01 DIAGNOSIS — Z125 Encounter for screening for malignant neoplasm of prostate: Secondary | ICD-10-CM

## 2023-03-01 DIAGNOSIS — R739 Hyperglycemia, unspecified: Secondary | ICD-10-CM | POA: Diagnosis not present

## 2023-03-01 DIAGNOSIS — E785 Hyperlipidemia, unspecified: Secondary | ICD-10-CM | POA: Diagnosis not present

## 2023-03-01 DIAGNOSIS — I10 Essential (primary) hypertension: Secondary | ICD-10-CM

## 2023-03-01 DIAGNOSIS — Z0001 Encounter for general adult medical examination with abnormal findings: Secondary | ICD-10-CM

## 2023-03-01 LAB — COMPREHENSIVE METABOLIC PANEL
ALT: 16 U/L (ref 0–53)
AST: 15 U/L (ref 0–37)
Albumin: 4.3 g/dL (ref 3.5–5.2)
Alkaline Phosphatase: 41 U/L (ref 39–117)
BUN: 17 mg/dL (ref 6–23)
CO2: 28 meq/L (ref 19–32)
Calcium: 9.3 mg/dL (ref 8.4–10.5)
Chloride: 105 meq/L (ref 96–112)
Creatinine, Ser: 0.8 mg/dL (ref 0.40–1.50)
GFR: 94.83 mL/min (ref >60.00)
Glucose, Bld: 99 mg/dL (ref 70–99)
Potassium: 4.2 meq/L (ref 3.5–5.1)
Sodium: 142 meq/L (ref 135–145)
Total Bilirubin: 0.6 mg/dL (ref 0.2–1.2)
Total Protein: 6.6 g/dL (ref 6.0–8.3)

## 2023-03-01 LAB — CBC
HCT: 43.8 % (ref 39.0–52.0)
Hemoglobin: 14.4 g/dL (ref 13.0–17.0)
MCHC: 32.8 g/dL (ref 30.0–36.0)
MCV: 91.3 fL (ref 78.0–100.0)
Platelets: 273 10*3/uL (ref 150.0–400.0)
RBC: 4.8 Mil/uL (ref 4.22–5.81)
RDW: 14 % (ref 11.5–15.5)
WBC: 4.6 10*3/uL (ref 4.0–10.5)

## 2023-03-01 LAB — LIPID PANEL
Cholesterol: 143 mg/dL (ref 0–200)
HDL: 42.3 mg/dL (ref >39.00)
LDL Cholesterol: 83 mg/dL (ref 0–99)
NonHDL: 101.04
Total CHOL/HDL Ratio: 3
Triglycerides: 89 mg/dL (ref 0.0–149.0)
VLDL: 17.8 mg/dL (ref 0.0–40.0)

## 2023-03-01 LAB — PSA: PSA: 4.03 ng/mL — ABNORMAL HIGH (ref 0.10–4.00)

## 2023-03-01 LAB — HEMOGLOBIN A1C: Hgb A1c MFr Bld: 5.9 % (ref 4.6–6.5)

## 2023-03-01 LAB — TSH: TSH: 1 u[IU]/mL (ref 0.35–5.50)

## 2023-03-01 NOTE — Patient Instructions (Signed)
It was very nice to see you today!  We will check blood work today.  Please continue the great work with your diet and exercise.  I will see back in year for your next physical.  Come back sooner if needed.  Return in about 1 year (around 02/29/2024).   Take care, Dr Jimmey Ralph  PLEASE NOTE:  If you had any lab tests, please let us know if you have not heard back within a few days. You may see your results on mychart before we have a chance to review them but we will give you a call once they are reviewed by [place].   If we ordered any referrals today, please let us know if you have not heard from their office within the next week.   If you had any urgent prescriptions sent in today, please check with the pharmacy within an hour of our visit to make sure the prescription was transmitted appropriately.   Please try these tips to maintain a healthy lifestyle:  Eat at least 3 REAL meals and 1-2 snacks per day.  Aim for no more than 5 hours between eating.  If you eat breakfast, please do so within one hour of getting up.   Each meal should contain half fruits/vegetables, one quarter protein, and one quarter carbs (no bigger than a computer mouse)  Cut down on sweet beverages. This includes juice, soda, and sweet tea.   Drink at least 1 glass of water with each meal and aim for at least 8 glasses per day  Exercise at least 150 minutes every week.    Preventive Care 83-75 Years Old, Male Preventive care refers to lifestyle choices and visits with your health care provider that can promote health and wellness. Preventive care visits are also called wellness exams. What can I expect for my preventive care visit? Counseling During your preventive care visit, your health care provider may ask about your: Medical history, including: Past medical problems. Family medical history. Current health, including: Emotional well-being. Home life and relationship well-being. Sexual  activity. Lifestyle, including: Alcohol, nicotine or tobacco, and drug use. Access to firearms. Diet, exercise, and sleep habits. Safety issues such as seatbelt and bike helmet use. Sunscreen use. Work and work Astronomer. Physical exam Your health care provider will check your: Height and weight. These may be used to calculate your BMI (body mass index). BMI is a measurement that tells if you are at a healthy weight. Waist circumference. This measures the distance around your waistline. This measurement also tells if you are at a healthy weight and may help predict your risk of certain diseases, such as type 2 diabetes and high blood pressure. Heart rate and blood pressure. Body temperature. Skin for abnormal spots. What immunizations do I need?  Vaccines are usually given at various ages, according to a schedule. Your health care provider will recommend vaccines for you based on your age, medical history, and lifestyle or other factors, such as travel or where you work. What tests do I need? Screening Your health care provider may recommend screening tests for certain conditions. This may include: Lipid and cholesterol levels. Diabetes screening. This is done by checking your blood sugar (glucose) after you have not eaten for a while (fasting). Hepatitis B test. Hepatitis C test. HIV (human immunodeficiency virus) test. STI (sexually transmitted infection) testing, if you are at risk. Lung cancer screening. Prostate cancer screening. Colorectal cancer screening. Talk with your health care provider about your test results, treatment  options, and if necessary, the need for more tests. Follow these instructions at home: Eating and drinking  Eat a diet that includes fresh fruits and vegetables, whole grains, lean protein, and low-fat dairy products. Take vitamin and mineral supplements as recommended by your health care provider. Do not drink alcohol if your health care provider  tells you not to drink. If you drink alcohol: Limit how much you have to 0-2 drinks a day. Know how much alcohol is in your drink. In the [place] one drink equals one 12 oz bottle of beer (355 mL), one 5 oz glass of wine (148 mL), or one 1 oz glass of hard liquor (44 mL). Lifestyle Brush your teeth every morning and night with fluoride toothpaste. Floss one time each day. Exercise for at least 30 minutes 5 or more days each week. Do not use any products that contain nicotine or tobacco. These products include cigarettes, chewing tobacco, and vaping devices, such as e-cigarettes. If you need help quitting, ask your health care provider. Do not use drugs. If you are sexually active, practice safe sex. Use a condom or other form of protection to prevent STIs. Take aspirin only as told by your health care provider. Make sure that you understand how much to take and what form to take. Work with your health care provider to find out whether it is safe and beneficial for you to take aspirin daily. Find healthy ways to manage stress, such as: Meditation, yoga, or listening to music. Journaling. Talking to a trusted person. Spending time with friends and family. Minimize exposure to UV radiation to reduce your risk of skin cancer. Safety Always wear your seat belt while driving or riding in a vehicle. Do not drive: If you have been drinking alcohol. Do not ride with someone who has been drinking. When you are tired or distracted. While texting. If you have been using any mind-altering substances or drugs. Wear a helmet and other protective equipment during sports activities. If you have firearms in your house, make sure you follow all gun safety procedures. What's next? Go to your health care provider once a year for an annual wellness visit. Ask your health care provider how often you should have your eyes and teeth checked. Stay up to date on all vaccines. This information is not intended to  replace advice given to you by your health care provider. Make sure you discuss any questions you have with your health care provider. Document Revised: 10/23/2020 Document Reviewed: 10/23/2020 Elsevier Patient Education  2024 [employer].

## 2023-03-01 NOTE — Assessment & Plan Note (Signed)
Check labs.  On Lipitor 20 mg daily.He is doing a great job with diet and exercises.

## 2023-03-01 NOTE — Assessment & Plan Note (Signed)
Blood pressure at goal today on lisinopril 10 mg daily.

## 2023-03-01 NOTE — Assessment & Plan Note (Signed)
Check A1c. 

## 2023-03-01 NOTE — Progress Notes (Signed)
Chief Complaint:  Sean [name] is a 62 y.o. male who presents today for his annual comprehensive physical exam.    Assessment/Plan:  Chronic Problems Addressed Today: Hypertension Blood pressure at goal today on lisinopril 10 mg daily.  Hyperlipidemia Check labs.  On Lipitor 20 mg daily.He is doing a great job with diet and exercises.  Erectile dysfunction Stable on sildenafil as needed.  Hyperglycemia Check A1c.   Preventative Healthcare: Check labs.  Up-to-date on colon cancer screening.  Patient Counseling(The following topics were reviewed and/or handout was given):  -Nutrition: Stressed importance of moderation in sodium/caffeine intake, saturated fat and cholesterol, caloric balance, sufficient intake of fresh fruits, vegetables, and fiber.  -Stressed the importance of regular exercise.   -Substance Abuse: Discussed cessation/primary prevention of tobacco, alcohol, or other drug use; driving or other dangerous activities under the influence; availability of treatment for abuse.   -Injury prevention: Discussed safety belts, safety helmets, smoke detector, smoking near bedding or upholstery.   -Sexuality: Discussed sexually transmitted diseases, partner selection, use of condoms, avoidance of unintended pregnancy and contraceptive alternatives.   -Dental health: Discussed importance of regular tooth brushing, flossing, and dental visits.  -Health maintenance and immunizations reviewed. Please refer to Health maintenance section.  Return to care in 1 year for next preventative visit.     Subjective:  HPI:  He has no acute complaints today. See Assessment / plan for status of chronic conditions.   Lifestyle Diet: Balanced. Trying to cut on portions.  Exercise: Not much.      03/01/2023    8:21 AM  Depression screen PHQ 2/9  Decreased Interest 0  Down, Depressed, Hopeless 0  PHQ - 2 Score 0   There are no preventive care reminders to display for this patient.    ROS: Per HPI, otherwise a complete review of systems was negative.   PMH:  The following were reviewed and entered/updated in epic: Past Medical History:  Diagnosis Date   Allergy    seasonal   GERD (gastroesophageal reflux disease)    occasional   Hyperlipidemia    Hypertension    Sleep apnea    Patient Active Problem List   Diagnosis Date Noted   Hyperglycemia 12/24/2017   Erectile dysfunction 12/23/2017   Postnasal drip 12/23/2017   Chronic right-sided low back pain without sciatica 12/23/2017   Hypertension 12/25/2016   Hyperlipidemia 12/25/2016   Lipoma 12/25/2016   Chronic pain of left knee 12/08/2016   Past Surgical History:  Procedure Laterality Date   INSERTION OF MESH N/A 03/17/2018   Procedure: INSERTION OF MESH;  Surgeon: Harriette Bouillon, MD;  Location: [facility];  Service: General;  Laterality: N/A;   KNEE SURGERY     left knee arthroscopy   VENTRAL HERNIA REPAIR N/A 03/17/2018   Procedure: REPAIR VENTRAL HERNIA WITH MESH;  Surgeon: Harriette Bouillon, MD;  Location: [facility];  Service: General;  Laterality: N/A;    Family History  Problem Relation Age of Onset   Prostate cancer Father    Prostate cancer Paternal Uncle        diagnosed in 2007   Colon cancer Neg Hx    Colon polyps Neg Hx    Esophageal cancer Neg Hx    Stomach cancer Neg Hx    Rectal cancer Neg Hx     Medications- reviewed and updated Current Outpatient Medications  Medication Sig Dispense Refill   Ascorbic Acid (VITAMIN C PO) Take 500 mg by mouth daily.  atorvastatin (LIPITOR) 20 MG tablet TAKE 1 TABLET BY MOUTH EVERY DAY 90 tablet 3   lisinopril (ZESTRIL) 10 MG tablet TAKE 1 TABLET BY MOUTH EVERY DAY 90 tablet 3   Multiple Vitamin (MULTIVITAMIN) capsule Take 1 capsule by mouth daily.     sildenafil (REVATIO) 20 MG tablet TAKE 1-5 TABLETS (20-100 MG TOTAL) BY MOUTH DAILY AS NEEDED (ERECTILE DYSFUNCTION). 15 tablet 23   No current  facility-administered medications for this visit.    Allergies-reviewed and updated No Known Allergies  Social History   Socioeconomic History   Marital status: Married    Spouse name: Not on file   Number of children: 3   Years of education: Not on file   Highest education level: Not on file  Occupational History   Not on file  Tobacco Use   Smoking status: Never   Smokeless tobacco: Never  Vaping Use   Vaping status: Never Used  Substance and Sexual Activity   Alcohol use: Yes    Comment: Drinks rarely   Drug use: No   Sexual activity: Not on file  Other Topics Concern   Not on file  Social History Narrative   Not on file   Social Determinants of Health   Financial Resource Strain: Not on file  Food Insecurity: Not on file  Transportation Needs: Not on file  Physical Activity: Not on file  Stress: Not on file  Social Connections: Not on file        Objective:  Physical Exam: BP 124/77   Pulse 67   Temp 98 F (36.7 C) (Temporal)   Ht 6\' 2"  (1.88 m)   Wt 218 lb 6.4 oz (99.1 kg)   SpO2 97%   BMI 28.04 kg/m   Body mass index is 28.04 kg/m. Wt Readings from Last 3 Encounters:  03/01/23 218 lb 6.4 oz (99.1 kg)  02/25/22 225 lb 6.4 oz (102.2 kg)  07/28/21 226 lb (102.5 kg)   Gen: NAD, resting comfortably HEENT: TMs normal bilaterally. OP clear. No thyromegaly noted.  CV: RRR with no murmurs appreciated Pulm: NWOB, CTAB with no crackles, wheezes, or rhonchi GI: Normal bowel sounds present. Soft, Nontender, Nondistended. MSK: no edema, cyanosis, or clubbing noted Skin: warm, dry Neuro: CN2-12 grossly intact. Strength 5/5 in upper and lower extremities. Reflexes symmetric and intact bilaterally.  Psych: Normal affect and thought content     Eleanor Gatliff M. Jimmey Ralph, MD 03/01/2023 9:05 AM

## 2023-03-01 NOTE — Assessment & Plan Note (Signed)
Stable on sildenafil as needed. 

## 2023-03-04 NOTE — Progress Notes (Signed)
His PSA is trending up.  This is probably due to an enlarged prostate however recommend that he see urology for further evaluation.  Please place referral.  The rest of his labs are all stable.  He should continue to work on diet and exercise and we can recheck everything else in a year or so.

## 2023-03-10 ENCOUNTER — Other Ambulatory Visit: Payer: Self-pay | Admitting: *Deleted

## 2023-03-10 DIAGNOSIS — R972 Elevated prostate specific antigen [PSA]: Secondary | ICD-10-CM

## 2023-06-03 ENCOUNTER — Other Ambulatory Visit: Payer: Self-pay | Admitting: Urology

## 2023-06-03 DIAGNOSIS — R972 Elevated prostate specific antigen [PSA]: Secondary | ICD-10-CM

## 2023-07-22 ENCOUNTER — Other Ambulatory Visit: Payer: 59

## 2023-08-16 ENCOUNTER — Other Ambulatory Visit: Payer: 59

## 2023-09-22 ENCOUNTER — Ambulatory Visit
Admission: RE | Admit: 2023-09-22 | Discharge: 2023-09-22 | Disposition: A | Discharge status: Home / Self Care | Source: Ambulatory Visit | Attending: Urology | Admitting: Urology

## 2023-09-22 DIAGNOSIS — R972 Elevated prostate specific antigen [PSA]: Secondary | ICD-10-CM

## 2023-09-22 MED ORDER — GADOPICLENOL 0.5 MMOL/ML IV SOLN
10.0000 mL | Freq: Once | INTRAVENOUS | Status: AC | PRN
  Administered 2023-09-22: 10 mL via INTRAVENOUS

## 2023-10-05 LAB — PSA: PSA: 4.16

## 2023-10-12 ENCOUNTER — Encounter: Payer: Self-pay | Admitting: Family Medicine

## 2023-12-13 ENCOUNTER — Other Ambulatory Visit: Payer: Self-pay | Admitting: Urology

## 2023-12-28 NOTE — Progress Notes (Signed)
 Anesthesia Review:  PCP: Worth Kitty- LOV 03/01/23  Cardiologist :  PPM/ ICD: Device Orders: Rep Notified:  Chest x-ray : EKG : Echo : Stress test: Cardiac Cath :   Activity level:  Sleep Study/ CPAP : Fasting Blood Sugar :      / Checks Blood Sugar -- times a day:    Blood Thinner/ Instructions /Last Dose: ASA / Instructions/ Last Dose :

## 2023-12-28 NOTE — Patient Instructions (Signed)
 SURGICAL WAITING ROOM VISITATION  Patients having surgery or a procedure may have no more than 2 support people in the waiting area - these visitors may rotate.    Children under the age of 85 must have an adult with them who is not the patient.  Visitors with respiratory illnesses are discouraged from visiting and should remain at home.  If the patient needs to stay at the hospital during part of their recovery, the visitor guidelines for inpatient rooms apply. Pre-op nurse will coordinate an appropriate time for 1 support person to accompany patient in pre-op.  This support person may not rotate.    Please refer to the [facility] website for the visitor guidelines for Inpatients (after your surgery is over and you are in a regular room).       Your procedure is scheduled on:  01/11/2024    Report to [facility] Main Entrance    Report to admitting at   [phone]   Call this number if you have problems the morning of surgery [phone]            Clear liquid diet the day before surgery.             Magnesium Citrate e- 8 ounces at 12 noon day before surgery.             Fleets enema nite before surgery           Nothing to drink after midnite the nite before surgery.                  If you have questions, please contact your surgeon's office.   FOLLOW BOWEL PREP AND ANY ADDITIONAL PRE OP INSTRUCTIONS YOU RECEIVED FROM YOUR SURGEON'S OFFICE!!!     Oral Hygiene is also important to reduce your risk of infection.                                    Remember - BRUSH YOUR TEETH THE MORNING OF SURGERY WITH YOUR REGULAR TOOTHPASTE  DENTURES WILL BE REMOVED PRIOR TO SURGERY PLEASE DO NOT APPLY Poly grip OR ADHESIVES!!!   Do NOT smoke after Midnight   Stop all vitamins and herbal supplements 7 days before surgery.   Take these medicines the morning of surgery with A SIP OF WATER:  none   DO NOT TAKE ANY ORAL DIABETIC MEDICATIONS DAY OF YOUR SURGERY  Bring CPAP mask  and tubing day of surgery.                              You may not have any metal on your body including hair pins, jewelry, and body piercing             Do not wear make-up, lotions, powders, perfumes/cologne, or deodorant  Do not wear nail polish including gel and S&S, artificial/acrylic nails, or any other type of covering on natural nails including finger and toenails. If you have artificial nails, gel coating, etc. that needs to be removed by a nail salon please have this removed prior to surgery or surgery may need to be canceled/ delayed if the surgeon/ anesthesia feels like they are unable to be safely monitored.   Do not shave  48 hours prior to surgery.               Men  may shave face and neck.   Do not bring valuables to the hospital. [facility] IS NOT             RESPONSIBLE   FOR VALUABLES.   Contacts, glasses, dentures or bridgework may not be worn into surgery.   Bring small overnight bag day of surgery.   DO NOT BRING YOUR HOME MEDICATIONS TO THE HOSPITAL. PHARMACY WILL DISPENSE MEDICATIONS LISTED ON YOUR MEDICATION LIST TO YOU DURING YOUR ADMISSION IN THE HOSPITAL!    Patients discharged on the day of surgery will not be allowed to drive home.  Someone NEEDS to stay with you for the first 24 hours after anesthesia.   Special Instructions: Bring a copy of your healthcare power of attorney and living will documents the day of surgery if you haven't scanned them before.              Please read over the following fact sheets you were given: IF YOU HAVE QUESTIONS ABOUT YOUR PRE-OP INSTRUCTIONS PLEASE CALL [phone].   If you received a COVID test during your pre-op visit  it is requested that you wear a mask when out in public, stay away from anyone that may not be feeling well and notify your surgeon if you develop symptoms. If you test positive for Covid or have been in contact with anyone that has tested positive in the last 10 days please notify you surgeon.    [facility] - Preparing for Surgery Before surgery, you can play an important role.  Because skin is not sterile, your skin needs to be as free of germs as possible.  You can reduce the number of germs on your skin by washing with CHG (chlorahexidine gluconate) soap before surgery.  CHG is an antiseptic cleaner which kills germs and bonds with the skin to continue killing germs even after washing. Please DO NOT use if you have an allergy to CHG or antibacterial soaps.  If your skin becomes reddened/irritated stop using the CHG and inform your nurse when you arrive at Short Stay. Do not shave (including legs and underarms) for at least 48 hours prior to the first CHG shower.  You may shave your face/neck. Please follow these instructions carefully:  1.  Shower with CHG Soap the night before surgery and the  morning of Surgery.  2.  If you choose to wash your hair, wash your hair first as usual with your  normal  shampoo.  3.  After you shampoo, rinse your hair and body thoroughly to remove the  shampoo.                           4.  Use CHG as you would any other liquid soap.  You can apply chg directly  to the skin and wash                       Gently with a scrungie or clean washcloth.  5.  Apply the CHG Soap to your body ONLY FROM THE NECK DOWN.   Do not use on face/ open                           Wound or open sores. Avoid contact with eyes, ears mouth and genitals (private parts).  Wash face,  Genitals (private parts) with your normal soap.             6.  Wash thoroughly, paying special attention to the area where your surgery  will be performed.  7.  Thoroughly rinse your body with warm water from the neck down.  8.  DO NOT shower/wash with your normal soap after using and rinsing off  the CHG Soap.                9.  Pat yourself dry with a clean towel.            10.  Wear clean pajamas.            11.  Place clean sheets on your bed the night of your first shower and do not   sleep with pets. Day of Surgery : Do not apply any lotions/deodorants the morning of surgery.  Please wear clean clothes to the hospital/surgery center.  FAILURE TO FOLLOW THESE INSTRUCTIONS MAY RESULT IN THE CANCELLATION OF YOUR SURGERY PATIENT SIGNATURE_________________________________  NURSE SIGNATURE__________________________________  ________________________________________________________________________

## 2023-12-31 ENCOUNTER — Other Ambulatory Visit: Payer: Self-pay

## 2023-12-31 ENCOUNTER — Encounter (HOSPITAL_COMMUNITY): Payer: Self-pay

## 2023-12-31 ENCOUNTER — Encounter (HOSPITAL_COMMUNITY)
Admission: RE | Admit: 2023-12-31 | Discharge: 2023-12-31 | Disposition: A | Discharge status: Home / Self Care | Source: Ambulatory Visit | Attending: Urology | Admitting: Urology

## 2023-12-31 VITALS — BP 141/93 | HR 75 | Temp 98.4°F | Resp 16 | Ht 74.5 in | Wt 218.0 lb

## 2023-12-31 DIAGNOSIS — Z01818 Encounter for other preprocedural examination: Secondary | ICD-10-CM | POA: Diagnosis present

## 2023-12-31 HISTORY — DX: Malignant (primary) neoplasm, unspecified: C80.1

## 2023-12-31 LAB — TYPE AND SCREEN
ABO/RH(D): A POS
Antibody Screen: NEGATIVE

## 2023-12-31 LAB — BASIC METABOLIC PANEL WITH GFR
Anion gap: 10 (ref 5–15)
BUN: 17 mg/dL (ref 8–23)
CO2: 25 mmol/L (ref 22–32)
Calcium: 9.3 mg/dL (ref 8.9–10.3)
Chloride: 105 mmol/L (ref 98–111)
Creatinine, Ser: 0.89 mg/dL (ref 0.61–1.24)
GFR, Estimated: >60 mL/min (ref >60)
Glucose, Bld: 117 mg/dL — ABNORMAL HIGH (ref 70–99)
Potassium: 4.4 mmol/L (ref 3.5–5.1)
Sodium: 140 mmol/L (ref 135–145)

## 2023-12-31 LAB — CBC
HCT: 43.7 % (ref 39.0–52.0)
Hemoglobin: 14.1 g/dL (ref 13.0–17.0)
MCH: 28.9 pg (ref 26.0–34.0)
MCHC: 32.3 g/dL (ref 30.0–36.0)
MCV: 89.5 fL (ref 80.0–100.0)
Platelets: 273 K/uL (ref 150–400)
RBC: 4.88 MIL/uL (ref 4.22–5.81)
RDW: 13.2 % (ref 11.5–15.5)
WBC: 5.1 K/uL (ref 4.0–10.5)
nRBC: 0 % (ref 0.0–0.2)

## 2024-01-10 NOTE — H&P (Signed)
 63 year old male referred for elevated PSA.   PMH: HTN, HLD, lipoma, ED, right sided low back pain, no  EDY:yzmwpj, umbilical hernia. and left knee   [facility] nomogram: organ confined 66, ECE:33, LN involvment 4%, SV invasoin: 5%, progression free survival 86% and 77%   Elevated PSA:  06/02/2023: PSA 4.03 from 3.03 1 year ago, AUASS 2, father had hx of PCa patient had surgery did not die, diagnosed in his 55s, 2 uncles PCa, no GYN cancers, No GH, no UTI. Nocturia x1  10/11/2023: MRI demonstrates PI-RADS 4 lesion, 60 g prostate, PSA 4.16, 23% free. No blood thinners, no recent abx. Very strong family history of prostate cancer.  11/03/23: MRI fusion bx today, pt had blood prostate bx only able complete ROI lesions  11/17/23: GG2 disease, only able to biopsy ROI lesion due to bleeding, PSA 4.16. Long discussion on treatment recommended surgery.  12/27/23: discus surgery, review CT scan, Surgery 9/2. SHIM 17 mild ED, CT reviewed shows no abnormalties. Plan for R nerve spare, will not spare left discussed reason for this as unable ot complete template biopsy due to bleeding so unclear of status of lateral edges of prostate except for MRI.  01/11/24: plan for RALP today     ALLERGIES: No Known Allergies    MEDICATIONS: Lisinopril  10 MG Tablet  Atorvastatin  Calcium  20 MG Tablet  Sildenafil  Citrate 20 MG Tablet     GU PSH: Prostate Needle Biopsy - 11/03/2023     NON-GU PSH: Surgical Pathology, Gross And Microscopic Examination For Prostate Needle - 11/03/2023 Visit Complexity (formerly GPC1X) - 11/17/2023, 10/11/2023, 06/02/2023     GU PMH: Prostate Cancer - 12/24/2023, - 12/22/2023, - 12/20/2023, - 11/17/2023 Elevated PSA - 11/03/2023, - 10/11/2023, - 06/02/2023 Male ED, unspecified      PMH Notes: lipoma   NON-GU PMH: Muscle weakness (generalized) - 12/24/2023, - 12/20/2023 Hypercholesterolemia Hypertension Other chronic pain    FAMILY HISTORY: No Family History    SOCIAL HISTORY: Marital Status:  Married Current Smoking Status: Patient has never smoked.   Tobacco Use Assessment Completed: Used Tobacco in last 30 days? Does not use smokeless tobacco.    REVIEW OF SYSTEMS:    GU Review Male:   Patient denies frequent urination, hard to postpone urination, burning/ pain with urination, get up at night to urinate, leakage of urine, stream starts and stops, trouble starting your stream, have to strain to urinate , erection problems, and penile pain.  Gastrointestinal (Upper):   Patient denies nausea, vomiting, and indigestion/ heartburn.  Gastrointestinal (Lower):   Patient denies diarrhea and constipation.  Constitutional:   Patient denies fever, night sweats, weight loss, and fatigue.  Skin:   Patient denies skin rash/ lesion and itching.  Eyes:   Patient denies blurred vision and double vision.  Ears/ Nose/ Throat:   Patient denies sore throat and sinus problems.  Hematologic/Lymphatic:   Patient denies swollen glands and easy bruising.  Cardiovascular:   Patient denies leg swelling and chest pains.  Respiratory:   Patient denies cough and shortness of breath.  Endocrine:   Patient denies excessive thirst.  Musculoskeletal:   Patient denies back pain and joint pain.  Neurological:   Patient denies dizziness and headaches.  Psychologic:   Patient denies depression and anxiety.   VITAL SIGNS: None   MULTI-SYSTEM PHYSICAL EXAMINATION:    Constitutional: Well-nourished. No physical deformities. Normally developed. Good grooming.  Respiratory: No labored breathing, no use of accessory muscles.   Cardiovascular: Normal temperature, normal extremity pulses,  no swelling, no varicosities.     Complexity of Data:  Records Review:   IIEF Score  Urine Test Review:   Urinalysis  X-Ray Review: C.T. Abdomen/Pelvis: Reviewed Films. Reviewed Report. Discussed With Patient. NO evidence of metastatic disease, no no evidence of umbilical hernia, large prostate     10/05/23  PSA  Total PSA  4.16 ng/mL  Free PSA 0.96 ng/mL  % Free PSA 23 % PSA   Notes:                     SHIM 17 mild ED   PROCEDURES:          Visit Complexity - G2211          Urinalysis Dipstick Dipstick Cont'd  Color: Yellow Bilirubin: Neg mg/dL  Appearance: Clear Ketones: Neg mg/dL  Specific Gravity: 8.974 Blood: Neg ery/uL  pH: 5.5 Protein: Trace mg/dL  Glucose: Neg mg/dL Urobilinogen: 0.2 mg/dL    Nitrites: Neg    Leukocyte Esterase: Neg leu/uL    ASSESSMENT:      ICD-10 Details  1 GU:   Prostate Cancer - C61 Chronic, Stable   PLAN:           Orders Labs Urine Culture          Document Letter(s):  Created for Patient: Clinical Summary         Notes:   Favorable intermediate risk prostate cancer: Due to patient bleeding during prostate biopsy did not complete the templated biopsy. MRI is concerning for mass lesion in the left discussed the patient's we will plan to do a right sided nerve spare, will not spare the left side. Decision was made as biopsy does not evaluate cancer status in the lateral aspect of the prostate.   We discussed the risk benefits and alternatives to surgery including bleeding infection damage surrounding structures possible DVT PE possible masslike leak possible lymphatic leak. All the patient's questions were answered appropriately.  Urine culture negative  Plan for RALP today   Will plan to spare R nerve and take left sided nerves

## 2024-01-11 ENCOUNTER — Observation Stay (HOSPITAL_COMMUNITY)
Admission: RE | Admit: 2024-01-11 | Discharge: 2024-01-12 | Disposition: A | Discharge status: Home / Self Care | Attending: Urology | Admitting: Urology

## 2024-01-11 ENCOUNTER — Encounter (HOSPITAL_COMMUNITY): Payer: Self-pay | Admitting: Urology

## 2024-01-11 ENCOUNTER — Ambulatory Visit (HOSPITAL_BASED_OUTPATIENT_CLINIC_OR_DEPARTMENT_OTHER): Admitting: Certified Registered Nurse Anesthetist

## 2024-01-11 ENCOUNTER — Ambulatory Visit (HOSPITAL_COMMUNITY): Payer: Self-pay | Admitting: Physician Assistant

## 2024-01-11 ENCOUNTER — Encounter (HOSPITAL_COMMUNITY)
Admission: RE | Disposition: A | Discharge status: Home / Self Care | Payer: Self-pay | Source: Home / Self Care | Attending: Urology

## 2024-01-11 ENCOUNTER — Other Ambulatory Visit: Payer: Self-pay

## 2024-01-11 DIAGNOSIS — Z79899 Other long term (current) drug therapy: Secondary | ICD-10-CM | POA: Insufficient documentation

## 2024-01-11 DIAGNOSIS — C61 Malignant neoplasm of prostate: Secondary | ICD-10-CM | POA: Diagnosis not present

## 2024-01-11 DIAGNOSIS — Z01818 Encounter for other preprocedural examination: Secondary | ICD-10-CM

## 2024-01-11 DIAGNOSIS — I10 Essential (primary) hypertension: Secondary | ICD-10-CM | POA: Insufficient documentation

## 2024-01-11 HISTORY — PX: ROBOT ASSISTED LAPAROSCOPIC RADICAL PROSTATECTOMY: SHX5141

## 2024-01-11 LAB — CBC
HCT: 41.4 % (ref 39.0–52.0)
Hemoglobin: 13.2 g/dL (ref 13.0–17.0)
MCH: 28.9 pg (ref 26.0–34.0)
MCHC: 31.9 g/dL (ref 30.0–36.0)
MCV: 90.6 fL (ref 80.0–100.0)
Platelets: 258 K/uL (ref 150–400)
RBC: 4.57 MIL/uL (ref 4.22–5.81)
RDW: 13.2 % (ref 11.5–15.5)
WBC: 12.2 K/uL — ABNORMAL HIGH (ref 4.0–10.5)
nRBC: 0 % (ref 0.0–0.2)

## 2024-01-11 LAB — BASIC METABOLIC PANEL WITH GFR
Anion gap: 12 (ref 5–15)
BUN: 11 mg/dL (ref 8–23)
CO2: 24 mmol/L (ref 22–32)
Calcium: 8.7 mg/dL — ABNORMAL LOW (ref 8.9–10.3)
Chloride: 105 mmol/L (ref 98–111)
Creatinine, Ser: 0.93 mg/dL (ref 0.61–1.24)
GFR, Estimated: >60 mL/min (ref >60)
Glucose, Bld: 187 mg/dL — ABNORMAL HIGH (ref 70–99)
Potassium: 4.5 mmol/L (ref 3.5–5.1)
Sodium: 141 mmol/L (ref 135–145)

## 2024-01-11 LAB — ABO/RH: ABO/RH(D): A POS

## 2024-01-11 SURGERY — ROBOTIC ASSISTED LAPAROSCOPIC RADICAL PROSTATECTOMY
Anesthesia: General | Site: Pelvis

## 2024-01-11 MED ORDER — ONDANSETRON HCL 4 MG/2ML IJ SOLN
4.0000 mg | INTRAMUSCULAR | Status: DC | PRN
  Administered 2024-01-11: 4 mg via INTRAVENOUS
  Filled 2024-01-11: qty 2

## 2024-01-11 MED ORDER — DEXAMETHASONE SODIUM PHOSPHATE 4 MG/ML IJ SOLN
INTRAMUSCULAR | Status: DC | PRN
  Administered 2024-01-11: 5 mg via INTRAVENOUS

## 2024-01-11 MED ORDER — LACTATED RINGERS IV SOLN: INTRAVENOUS | Status: DC

## 2024-01-11 MED ORDER — POLYETHYLENE GLYCOL 3350 17 G PO PACK
17.0000 g | PACK | Freq: Every day | ORAL | Status: DC
  Administered 2024-01-11 – 2024-01-12 (×2): 17 g via ORAL
  Filled 2024-01-11 (×2): qty 1

## 2024-01-11 MED ORDER — BUPIVACAINE HCL (PF) 0.25 % IJ SOLN
INTRAMUSCULAR | Status: DC | PRN
  Administered 2024-01-11: 20 mL
  Administered 2024-01-11: 10 mL

## 2024-01-11 MED ORDER — CEFAZOLIN SODIUM-DEXTROSE 1-4 GM/50ML-% IV SOLN
1.0000 g | Freq: Three times a day (TID) | INTRAVENOUS | Status: AC
  Administered 2024-01-11 – 2024-01-12 (×2): 1 g via INTRAVENOUS
  Filled 2024-01-11 (×2): qty 50

## 2024-01-11 MED ORDER — SUGAMMADEX SODIUM 200 MG/2ML IV SOLN
INTRAVENOUS | Status: AC
  Filled 2024-01-11: qty 2

## 2024-01-11 MED ORDER — HEPARIN SODIUM (PORCINE) 5000 UNIT/ML IJ SOLN
5000.0000 [IU] | Freq: Once | INTRAMUSCULAR | Status: AC
  Administered 2024-01-11: 5000 [IU] via SUBCUTANEOUS
  Filled 2024-01-11: qty 1

## 2024-01-11 MED ORDER — HYDROMORPHONE HCL 1 MG/ML IJ SOLN
0.5000 mg | INTRAMUSCULAR | Status: DC | PRN
  Administered 2024-01-11: 1 mg via INTRAVENOUS
  Filled 2024-01-11: qty 1

## 2024-01-11 MED ORDER — METHOCARBAMOL 500 MG PO TABS: 500.0000 mg | ORAL_TABLET | Freq: Three times a day (TID) | ORAL | 0 refills | Status: DC

## 2024-01-11 MED ORDER — ATORVASTATIN CALCIUM 20 MG PO TABS
20.0000 mg | ORAL_TABLET | Freq: Every day | ORAL | Status: DC
  Administered 2024-01-11 – 2024-01-12 (×2): 20 mg via ORAL
  Filled 2024-01-11 (×2): qty 1

## 2024-01-11 MED ORDER — BUPIVACAINE HCL (PF) 0.25 % IJ SOLN
INTRAMUSCULAR | Status: AC
  Filled 2024-01-11: qty 30

## 2024-01-11 MED ORDER — CEFAZOLIN SODIUM-DEXTROSE 2-4 GM/100ML-% IV SOLN
2.0000 g | INTRAVENOUS | Status: AC
  Administered 2024-01-11 (×2): 2 g via INTRAVENOUS
  Filled 2024-01-11: qty 100

## 2024-01-11 MED ORDER — METHOCARBAMOL 500 MG PO TABS
1000.0000 mg | ORAL_TABLET | Freq: Four times a day (QID) | ORAL | Status: DC
  Administered 2024-01-11 – 2024-01-12 (×5): 1000 mg via ORAL
  Filled 2024-01-11 (×5): qty 2

## 2024-01-11 MED ORDER — SODIUM CHLORIDE (PF) 0.9 % IJ SOLN
INTRAMUSCULAR | Status: AC
  Filled 2024-01-11: qty 20

## 2024-01-11 MED ORDER — OXYCODONE HCL 5 MG PO TABS
5.0000 mg | ORAL_TABLET | ORAL | Status: DC | PRN
  Administered 2024-01-12: 5 mg via ORAL
  Filled 2024-01-11: qty 1

## 2024-01-11 MED ORDER — ACETAMINOPHEN 500 MG PO TABS
1000.0000 mg | ORAL_TABLET | Freq: Four times a day (QID) | ORAL | Status: AC
  Administered 2024-01-11 – 2024-01-12 (×4): 1000 mg via ORAL
  Filled 2024-01-11 (×4): qty 2

## 2024-01-11 MED ORDER — ORAL CARE MOUTH RINSE: 15.0000 mL | Freq: Once | OROMUCOSAL | Status: AC

## 2024-01-11 MED ORDER — OXYCODONE HCL 5 MG PO TABS
5.0000 mg | ORAL_TABLET | Freq: Once | ORAL | Status: AC | PRN
  Administered 2024-01-11: 5 mg via ORAL

## 2024-01-11 MED ORDER — BUPIVACAINE LIPOSOME 1.3 % IJ SUSP
INTRAMUSCULAR | Status: DC | PRN
  Administered 2024-01-11: 20 mL

## 2024-01-11 MED ORDER — SULFAMETHOXAZOLE-TRIMETHOPRIM 800-160 MG PO TABS: 1.0000 | ORAL_TABLET | Freq: Two times a day (BID) | ORAL | 0 refills | Status: DC

## 2024-01-11 MED ORDER — FENTANYL CITRATE PF 50 MCG/ML IJ SOSY
25.0000 ug | PREFILLED_SYRINGE | INTRAMUSCULAR | Status: DC | PRN
  Administered 2024-01-11 (×2): 50 ug via INTRAVENOUS

## 2024-01-11 MED ORDER — SENNOSIDES-DOCUSATE SODIUM 8.6-50 MG PO TABS
2.0000 | ORAL_TABLET | Freq: Every day | ORAL | Status: DC
  Administered 2024-01-11: 2 via ORAL
  Filled 2024-01-11: qty 2

## 2024-01-11 MED ORDER — MIDAZOLAM HCL 5 MG/5ML IJ SOLN
INTRAMUSCULAR | Status: DC | PRN
  Administered 2024-01-11: 2 mg via INTRAVENOUS

## 2024-01-11 MED ORDER — BUPIVACAINE LIPOSOME 1.3 % IJ SUSP
INTRAMUSCULAR | Status: AC
  Filled 2024-01-11: qty 20

## 2024-01-11 MED ORDER — ONDANSETRON HCL 4 MG/2ML IJ SOLN: 4.0000 mg | Freq: Four times a day (QID) | INTRAMUSCULAR | Status: DC | PRN

## 2024-01-11 MED ORDER — CHLORHEXIDINE GLUCONATE CLOTH 2 % EX PADS
6.0000 | MEDICATED_PAD | Freq: Every day | CUTANEOUS | Status: DC
  Administered 2024-01-11 – 2024-01-12 (×2): 6 via TOPICAL

## 2024-01-11 MED ORDER — MIDAZOLAM HCL 2 MG/2ML IJ SOLN
INTRAMUSCULAR | Status: AC
  Filled 2024-01-11: qty 2

## 2024-01-11 MED ORDER — ROCURONIUM BROMIDE 10 MG/ML (PF) SYRINGE
PREFILLED_SYRINGE | INTRAVENOUS | Status: DC | PRN
  Administered 2024-01-11: 20 mg via INTRAVENOUS
  Administered 2024-01-11: 70 mg via INTRAVENOUS
  Administered 2024-01-11 (×2): 20 mg via INTRAVENOUS
  Administered 2024-01-11: 10 mg via INTRAVENOUS
  Administered 2024-01-11: 30 mg via INTRAVENOUS

## 2024-01-11 MED ORDER — HYDROCODONE-ACETAMINOPHEN 5-325 MG PO TABS: 1.0000 | ORAL_TABLET | Freq: Four times a day (QID) | ORAL | 0 refills | Status: DC | PRN

## 2024-01-11 MED ORDER — SODIUM CHLORIDE 0.9 % IV SOLN: INTRAVENOUS | Status: DC | PRN

## 2024-01-11 MED ORDER — FENTANYL CITRATE PF 50 MCG/ML IJ SOSY
PREFILLED_SYRINGE | INTRAMUSCULAR | Status: AC
Start: 2024-01-11 — End: 2024-01-11
  Filled 2024-01-11: qty 3

## 2024-01-11 MED ORDER — OXYCODONE HCL 5 MG PO TABS
ORAL_TABLET | ORAL | Status: AC
  Filled 2024-01-11: qty 1

## 2024-01-11 MED ORDER — SUGAMMADEX SODIUM 200 MG/2ML IV SOLN
INTRAVENOUS | Status: DC | PRN
  Administered 2024-01-11: 200 mg via INTRAVENOUS

## 2024-01-11 MED ORDER — FENTANYL CITRATE (PF) 250 MCG/5ML IJ SOLN
INTRAMUSCULAR | Status: AC
Start: 2024-01-11 — End: 2024-01-11
  Filled 2024-01-11: qty 5

## 2024-01-11 MED ORDER — HYOSCYAMINE SULFATE 0.125 MG SL SUBL: 0.1250 mg | SUBLINGUAL_TABLET | SUBLINGUAL | Status: DC | PRN

## 2024-01-11 MED ORDER — DIPHENHYDRAMINE HCL 50 MG/ML IJ SOLN: 12.5000 mg | Freq: Four times a day (QID) | INTRAMUSCULAR | Status: DC | PRN

## 2024-01-11 MED ORDER — PROPOFOL 10 MG/ML IV BOLUS
INTRAVENOUS | Status: DC | PRN
  Administered 2024-01-11: 150 mg via INTRAVENOUS

## 2024-01-11 MED ORDER — CHLORHEXIDINE GLUCONATE 0.12 % MT SOLN
15.0000 mL | Freq: Once | OROMUCOSAL | Status: AC
  Administered 2024-01-11: 15 mL via OROMUCOSAL

## 2024-01-11 MED ORDER — OXYCODONE HCL 5 MG/5ML PO SOLN: 5.0000 mg | Freq: Once | ORAL | Status: AC | PRN

## 2024-01-11 MED ORDER — HEMOSTATIC AGENTS (NO CHARGE) OPTIME
TOPICAL | Status: DC | PRN
  Administered 2024-01-11: 1 via TOPICAL

## 2024-01-11 MED ORDER — ONDANSETRON HCL 4 MG/2ML IJ SOLN
INTRAMUSCULAR | Status: DC | PRN
  Administered 2024-01-11: 4 mg via INTRAVENOUS

## 2024-01-11 MED ORDER — SODIUM CHLORIDE 0.9 % IV BOLUS
1000.0000 mL | Freq: Once | INTRAVENOUS | Status: AC
  Administered 2024-01-11: 1000 mL via INTRAVENOUS

## 2024-01-11 MED ORDER — LACTATED RINGERS IR SOLN
Status: DC | PRN
  Administered 2024-01-11: 1

## 2024-01-11 MED ORDER — HYDROMORPHONE HCL 1 MG/ML IJ SOLN
INTRAMUSCULAR | Status: DC | PRN
  Administered 2024-01-11 (×2): 1 mg via INTRAVENOUS

## 2024-01-11 MED ORDER — DOCUSATE SODIUM 100 MG PO CAPS: 100.0000 mg | ORAL_CAPSULE | Freq: Two times a day (BID) | ORAL | Status: DC

## 2024-01-11 MED ORDER — FENTANYL CITRATE (PF) 100 MCG/2ML IJ SOLN
INTRAMUSCULAR | Status: DC | PRN
  Administered 2024-01-11 (×4): 50 ug via INTRAVENOUS
  Administered 2024-01-11: 100 ug via INTRAVENOUS
  Administered 2024-01-11: 50 ug via INTRAVENOUS

## 2024-01-11 MED ORDER — DIPHENHYDRAMINE HCL 12.5 MG/5ML PO ELIX: 12.5000 mg | ORAL_SOLUTION | Freq: Four times a day (QID) | ORAL | Status: DC | PRN

## 2024-01-11 SURGICAL SUPPLY — 67 items
APPLICATOR COTTON TIP 6 STRL (MISCELLANEOUS) ×1 IMPLANT
APPLICATOR SURGIFLO ENDO (HEMOSTASIS) IMPLANT
BAG COUNTER SPONGE SURGICOUNT (BAG) IMPLANT
CATH FOLEY 2WAY SLVR 18FR 30CC (CATHETERS) ×1 IMPLANT
CATH TIEMANN FOLEY 18FR 5CC (CATHETERS) ×1 IMPLANT
CHLORAPREP W/TINT 26 (MISCELLANEOUS) ×1 IMPLANT
CLIP LIGATING HEM O LOK PURPLE (MISCELLANEOUS) ×2 IMPLANT
CNTNR URN SCR LID CUP LEK RST (MISCELLANEOUS) ×1 IMPLANT
COVER SURGICAL LIGHT HANDLE (MISCELLANEOUS) ×1 IMPLANT
COVER TIP SHEARS 8 DVNC (MISCELLANEOUS) ×1 IMPLANT
CUTTER ECHEON FLEX ENDO 45 340 (ENDOMECHANICALS) ×1 IMPLANT
DERMABOND ADVANCED .7 DNX12 (GAUZE/BANDAGES/DRESSINGS) ×1 IMPLANT
DRAPE ARM DVNC X/XI (DISPOSABLE) ×4 IMPLANT
DRAPE COLUMN DVNC XI (DISPOSABLE) ×1 IMPLANT
DRAPE SURG IRRIG POUCH 19X23 (DRAPES) ×1 IMPLANT
DRIVER NDL LRG 8 DVNC XI (INSTRUMENTS) ×2 IMPLANT
DRIVER NDLE LRG 8 DVNC XI (INSTRUMENTS) ×2 IMPLANT
DRSG TEGADERM 4X4.75 (GAUZE/BANDAGES/DRESSINGS) ×1 IMPLANT
ELECT PENCIL ROCKER SW 15FT (MISCELLANEOUS) ×1 IMPLANT
ELECT REM PT RETURN 15FT ADLT (MISCELLANEOUS) ×1 IMPLANT
FORCEPS BPLR LNG DVNC XI (INSTRUMENTS) ×1 IMPLANT
FORCEPS PROGRASP DVNC XI (FORCEP) ×1 IMPLANT
GAUZE 4X4 16PLY ~~LOC~~+RFID DBL (SPONGE) IMPLANT
GAUZE SPONGE 2X2 8PLY STRL LF (GAUZE/BANDAGES/DRESSINGS) IMPLANT
GAUZE SPONGE 4X4 12PLY STRL (GAUZE/BANDAGES/DRESSINGS) ×1 IMPLANT
GLOVE BIO SURGEON STRL SZ 6.5 (GLOVE) ×1 IMPLANT
GLOVE BIOGEL PI IND STRL 7.5 (GLOVE) ×1 IMPLANT
GLOVE SURG LX STRL 7.5 STRW (GLOVE) ×2 IMPLANT
GOWN STRL REUS W/ TWL XL LVL3 (GOWN DISPOSABLE) ×2 IMPLANT
GOWN STRL SURGICAL XL XLNG (GOWN DISPOSABLE) ×1 IMPLANT
HEMOSTAT SURGICEL 2X4 FIBR (HEMOSTASIS) IMPLANT
HEMOSTAT SURGICEL 4X8 (HEMOSTASIS) IMPLANT
HOLDER FOLEY CATH W/STRAP (MISCELLANEOUS) ×1 IMPLANT
IRRIGATION SUCT STRKRFLW 2 WTP (MISCELLANEOUS) ×1 IMPLANT
IV LACTATED RINGERS 1000ML (IV SOLUTION) ×1 IMPLANT
KIT PROCEDURE DVNC SI (MISCELLANEOUS) ×1 IMPLANT
KIT TURNOVER KIT A (KITS) ×1 IMPLANT
NDL INSUFFLATION 14GA 120MM (NEEDLE) ×1 IMPLANT
NDL SPNL 22GX7 QUINCKE BK (NEEDLE) ×1 IMPLANT
NEEDLE INSUFFLATION 14GA 120MM (NEEDLE) ×1 IMPLANT
NEEDLE SPNL 22GX7 QUINCKE BK (NEEDLE) ×1 IMPLANT
PACK ROBOT UROLOGY CUSTOM (CUSTOM PROCEDURE TRAY) ×1 IMPLANT
PAD POSITIONING PINK XL (MISCELLANEOUS) ×1 IMPLANT
PLUG CATH AND CAP STRL 200 (CATHETERS) ×1 IMPLANT
PORT ACCESS TROCAR AIRSEAL 12 (TROCAR) ×1 IMPLANT
RELOAD STAPLE 45 4.1 GRN THCK (STAPLE) ×1 IMPLANT
SCISSORS LAP 5X45 EPIX DISP (ENDOMECHANICALS) IMPLANT
SCISSORS MNPLR CVD DVNC XI (INSTRUMENTS) ×1 IMPLANT
SEAL UNIV 5-12 XI (MISCELLANEOUS) ×4 IMPLANT
SET TRI-LUMEN FLTR TB AIRSEAL (TUBING) ×1 IMPLANT
SOL PREP POV-IOD 4OZ 10% (MISCELLANEOUS) ×1 IMPLANT
SOLUTION ELECTROSURG ANTI STCK (MISCELLANEOUS) ×1 IMPLANT
SPIKE FLUID TRANSFER (MISCELLANEOUS) ×1 IMPLANT
SPONGE T-LAP 4X18 ~~LOC~~+RFID (SPONGE) ×1 IMPLANT
SURGIFLO W/THROMBIN 8M KIT (HEMOSTASIS) IMPLANT
SUT ETHILON 3 0 PS 1 (SUTURE) ×1 IMPLANT
SUT MNCRL AB 4-0 PS2 18 (SUTURE) ×2 IMPLANT
SUT PDS AB 0 CT1 36 (SUTURE) IMPLANT
SUT PDS AB 1 CT1 27 (SUTURE) ×2 IMPLANT
SUT VIC AB 2-0 SH 27X BRD (SUTURE) ×1 IMPLANT
SUT VIC AB 3-0 SH 27XBRD (SUTURE) IMPLANT
SUT VICRYL 0 UR6 27IN ABS (SUTURE) ×1 IMPLANT
SUT VLOC 3-0 9IN GRN (SUTURE) IMPLANT
SUTURE VLOC BRB 180 ABS3/0GR12 (SUTURE) IMPLANT
SYR 27GX1/2 1ML LL SAFETY (SYRINGE) ×1 IMPLANT
SYRINGE TOOMEY IRRIG 70ML (MISCELLANEOUS) IMPLANT
WATER STERILE IRR 1000ML POUR (IV SOLUTION) ×1 IMPLANT

## 2024-01-11 NOTE — Transfer of Care (Signed)
 Immediate Anesthesia Transfer of Care Note  Patient: Sean [name]  Procedure(s) Performed: ROBOTIC ASSISTED LAPAROSCOPIC RADICAL PROSTATECTOMY (Pelvis) LYMPHADENECTOMY, PELVIS, ROBOT-ASSISTED (Pelvis)  Patient Location: PACU  Anesthesia Type:General  Level of Consciousness: awake and patient cooperative  Airway & Oxygen Therapy: Patient Spontanous Breathing and Patient connected to face mask  Post-op Assessment: Report given to RN and Post -op Vital signs reviewed and stable  Post vital signs: Reviewed and stable  Last Vitals:  Vitals Value Taken Time  BP 160/105 01/11/24 14:52  Temp    Pulse 90 01/11/24 14:54  Resp 11 01/11/24 14:54  SpO2 100 % 01/11/24 14:54  Vitals shown include unfiled device data.  Last Pain:  Vitals:   01/11/24 0735  TempSrc:   PainSc: 0-No pain         Complications: No notable events documented.

## 2024-01-11 NOTE — Discharge Instructions (Signed)

## 2024-01-11 NOTE — Anesthesia Procedure Notes (Signed)
 Procedure Name: Intubation Date/Time: 01/11/2024 10:01 AM  Performed by: Judythe Tanda Aran, CRNAPre-anesthesia Checklist: Patient identified, Emergency Drugs available, Suction available and Patient being monitored Patient Re-evaluated:Patient Re-evaluated prior to induction Oxygen Delivery Method: Circle system utilized Preoxygenation: Pre-oxygenation with 100% oxygen Induction Type: IV induction Ventilation: Mask ventilation without difficulty Laryngoscope Size: 2 and Miller Grade View: Grade II Tube type: Oral Tube size: 7.5 mm Number of attempts: 1 Airway Equipment and Method: Stylet Placement Confirmation: ETT inserted through vocal cords under direct vision, positive ETCO2 and breath sounds checked- equal and bilateral Secured at: 23 cm Tube secured with: Tape Dental Injury: Teeth and Oropharynx as per pre-operative assessment

## 2024-01-11 NOTE — Plan of Care (Signed)
  Problem: Pain Management: Goal: General experience of comfort will improve Outcome: Progressing   Problem: Clinical Measurements: Goal: Respiratory complications will improve Outcome: Progressing   Problem: Clinical Measurements: Goal: Cardiovascular complication will be avoided Outcome: Progressing   Problem: Activity: Goal: Risk for activity intolerance will decrease Outcome: Progressing

## 2024-01-11 NOTE — Anesthesia Preprocedure Evaluation (Signed)
Anesthesia Evaluation  Patient identified by MRN, date of birth, ID band Patient awake    Reviewed: Allergy & Precautions, H&P , NPO status , Patient's Chart, lab work & pertinent test results  Airway Mallampati: II   Neck ROM: full    Dental   Pulmonary neg pulmonary ROS   breath sounds clear to auscultation       Cardiovascular hypertension,  Rhythm:regular Rate:Normal     Neuro/Psych    GI/Hepatic   Endo/Other    Renal/GU    Prostate CA    Musculoskeletal   Abdominal   Peds  Hematology   Anesthesia Other Findings   Reproductive/Obstetrics                             Anesthesia Physical Anesthesia Plan  ASA: 2  Anesthesia Plan: General   Post-op Pain Management:    Induction: Intravenous  PONV Risk Score and Plan: 2 and Ondansetron, Dexamethasone, Midazolam and Treatment may vary due to age or medical condition  Airway Management Planned: Oral ETT  Additional Equipment:   Intra-op Plan:   Post-operative Plan: Extubation in OR  Informed Consent: I have reviewed the patients History and Physical, chart, labs and discussed the procedure including the risks, benefits and alternatives for the proposed anesthesia with the patient or authorized representative who has indicated his/her understanding and acceptance.     Dental advisory given  Plan Discussed with: CRNA, Anesthesiologist and Surgeon  Anesthesia Plan Comments:        Anesthesia Quick Evaluation

## 2024-01-11 NOTE — Op Note (Signed)
 Operative Note  Preoperative diagnosis:  1.  Localized prostate cancer  Postoperative diagnosis: 1.  Localized prostate cancer  Procedure(s): 1.  Robotic assisted laparoscopic radical prostatectomy (Partial nerve sparing) 2.  Robotic assisted laparoscopic bilateral pelvic lymph node dissection  Surgeon: Steffan Pea   Assistants:   Alan Hammonds, PA  An assistant was required for this surgical procedure.  The duties of the assistant included but were not limited to suctioning, passing suture, camera manipulation, retraction.  This procedure would not be able to be performed without an Geophysicist/field seismologist.     Anesthesia:  General  Complications:  None  EBL:  400  Specimens: 1.  Prostate with seminal vesicles 2.  Periprostatic fat 3. Bilateral pelvic lymph nodes  Drains/Catheters: 1.  18 French Foley catheter  Intraoperative findings:   Approximately 60 g prostate.  Right sided accessory.  No blood vessel.  Successful right nerve spare.  Excellent hemostasis.  Water-tight anastomosis without leak.  Indication:  Sean [name] is a 63 y.o. malewho initially presented with an elevated PSA.  Prostate biopsy showed Gleason 3+4 prostate cancer involving the left side of the prostate.  Due to bleeding during prostate biopsy only the ROI lesion was obtained on the left side therefore unclear about how advanced the disease on the left side due to this decision was made to do another nerve sparing on the left side.  Treatment options were discussed with him at length and he chose robotic assisted laparoscopic radical prostatectomy.  He has favorable intermediate risk and the plan is for right nerve sparing.  Bilateral pelvic lymph node dissection was planned due to his risk stratification.  Description of procedure: The indications, alternatives, benefits, and risks were discussed with the patient and informed symptoms obtained.  The patient was brought to the operating room table, positioned  supine and secured to the bed with a safety strap.  All pressure points were carefully padded and pneumatic compression devices were placed on lower extremities.  After the administration of intravenous antibiotics, subcutaneous heparin  preoperatively  and general endotracheal anesthesia, the patient was repositioned in the dorsal lithotomy position using well-padded Allen stirrups.  The arms were carefully tucked at the patient's side and secured with padding.  The chest was secured in place with foam padding and cloth tape and the table was positioned in approximately 24 degree Trendelenburg.  The patient's abdomen, genitalia, and upper thighs were prepped and draped in the standard sterile manner.  A time was completed, verifying the correct patient, surgical procedure and positioning prior to beginning the procedure.  An 81 French urethral catheter was inserted to drain the bladder.  Patient was taken out of Trendelenburg to a normal dorsolithotomy position.  After being prepped.  The Veress needle was then replaced in the abdomen in the midclavicular line 1 cm below the costal margin after several intense and inability to place Veress needle in the correct location after attempting pneumo decision was made to move to Palmer's point.   1 attempt of using the Veress needle at Palmer's point demonstrated good access to the abdomen the drop test and aspiration confirmed proper location.  The abdomen was then inflated to 15 mmHg.    A 5 mm port was placed to the assistant port.  Inspection of the abdomen at the place of the Veress needle placed demonstrated no injury to the bowel or other abnormalities.  We then placed the air seal on the lateral aspect of the abdomen and inflated the abdomen appropriately.  Using the air seal port the adhesions that were seen below the umbilicus from the previous umbilical hernia repair were taken down using cold scissors.  Incision was made 1cm above the umbilicus.  Dissection was taken down to the fascia.   The 0 degree camera was then passed under direct visualization.  The abdominal cavity was again examined for any sign of injury, adhesions, and identification of anatomic landmarks.  The remainder of the trochars were placed which included 2 separate 8 millimeter robotic trochars which were placed 9 cm laterally and inferiorly to the initially placed camera trocar.  An 8 mm trocar was placed on the right side adjacent to the air seal which was already replaced..  A separate 8 mm robotic trocar was placed 8 cm lateral to the previously placed left robotic trocar on the left side.  A 5 mm trocar was placed to the right and well above the umbilicus which approximately 10 and 12 cm away from the right-sided trochars.  The robot was then docked.  I placed monopolar scissors in the right hand, a fenestrated bipolar in the left hand and a prograsp in the fourth arm.  The sigmoid colon was then released from its lateral attachments setting to be retracted cranially.  Once this dissection was completed.  A 5 cm incision was made 1 cm above the peritoneal reflection dissection was taken through the tissue until did not manage was encountered.  The vas deferens was identified in the right dissected completely and freed around the tip of the central vesicle.  The vas was then ligated and the single vessel was dissected out.  The same exact dissection was taken down the left side as well upon dissection was taken through an obvious towards the prostatic apex.  The urachus and median umbilical ligament was divided and we developed the space of Retzius down to the pubic bone.  I divided the parietal peritoneum laterally up to the vas deferens on each side.  Using the prograsp forcep to provide cranial traction on the urachus, the prostate was then defatted above the prostatic vesicle junction, and the superficial dorsal venous complex was coagulated with bipolar and divided.  We  submitted the periprostatic fat for pathological specimen.  The endopelvic fascia was sharply opened bilaterally and the levator muscle fibers were swept posterior laterally allowing for visualization of the deep dorsal venous complex and apex of the prostate.  The puboprostatic ligaments were sharply divided and care was taken to preserve the dorsal venous complex.  The dorsal venous complex was then stapled using a 45 mm endovascular stapler.   0 degree lens was kept in place. I identified the bladder neck by pulling a Foley catheter.  The fourth arm was applying cranial traction on the urachus.  I divided the anterior bladder neck musculature until I found the anterior bladder neck mucosa which was then incised. I identified the Foley catheter within, the balloon was deflated, we pulled the Foley catheter out into the operating field.  The assistant then used a grasper to apply traction to the Foley catheter and the surgical tech then placed a [place] on the catheter near the penis for optimal retraction.  I then divided the lateral bladder neck mucosa in the posterior bladder neck mucosa.   I was well away from the bilateral ureteral orifices.  I divided the posterior bladder neck musculature until I discovered the longitudinal fibers and kept dissecting until I found the vas deferens.  I divided the Denonvilliers fascia beneath the prostate and developed the prostate off the rectum.  This plane was bluntly developed towards the apex of the prostate.  I then addressed the pedicles, first starting with the right and then moving towards the left. I then did a right nerve spare by dividing the lateral pelvic fascia off of the prostatic capsule laterally.  I then isolated the pedicles of the prostate and used the bipolar cautery to burn the pedicles and then divided the pedicles with cold scissors.  I continued to divide the neurovascular bundles off of the prostate out to the apex of the prostate.  At  this point the prostate was essentially freed up except for the urethra.   .  The anterior urethra was then sharply divided with cold scissors.  The Foley catheter was then pulled back and we divided the posterior urethral wall. Patent venous sinuses were then oversewn with a 3-0 Vicryl suture.  The specimen was then placed in a Endo Catch bag and then the bag was placed in the upper abdomen out of the way.  I then irrigated the pelvis.  We performed a rectal test by instilling air into the rectal Foley.  The test was negative.  There is no concern for rectal injury.  I then over sewed a few bleeders alongside the pedicles with a 3-0 Vicryl suture on an SH needle.  I then performed a bilateral pelvic lymph node dissection by incising the fascia overlying the right external iliac vein, dissecting distally.  I went just distal to the node of [place] were replaced clips and then divided the lymphatics.  The lateral aspect of the dissection was the pelvic sidewall, inferior was the obturator nerve and proximal of the hypogastric vessels.  I placed clips at the proximal aspect and then divided the lymphatics.  The specimen was removed with the scope grasper and sent to pathology.  Grossly, there were no enlarged lymph nodes.  This was performed on both the right and left pelvic lymph nodes.  With good hemostasis confirmed, I then did the posterior reconstruction with a Rocco stitch.  I used a 3-0 Vloc double-armed suture on an RB1 needle.  I passed the sutures through the retrotrigonal fascia beneath the bladder on the right side and then to the rectourethralis 4 throws were done on each side, I ran this from right to left.  And then took the other end of the suture passing just proximal to the posterior bladder neck in the midline into the posterior serrated sphincter and around this from right to left using 3 throws and reapproximated the sutures.  I then completed the urethrovesical anastomosis using a 3-0  Vloc suture on an RB1 needle.  I passed both ends of the suture from the outside in through the bladder neck at the 6 o'clock position.  I passed both through the urethral stump from the inside out and the corresponding position.  I reapproximated the bladder neck to the urethra.  I then ran the left suture on the left side anastomosis to the 9 o'clock position.  And then went back to the right-sided suture around that up the right side of the 12 o'clock position.  I then continued the left suture to the 12 o'clock position. I identified the ureteral orifices and ensured that these were not incorporated with the sutures.  I then placed a new 18 French Foley catheter into the bladder and filled it with 10 cc of sterile water.  I then secured the knot and then passed the suture behind the pubic bone for anterior suspension.  The bladder was irrigated with 150 cc of water.  There was no leak.  Hemostasis was excellent.  A 19 [place] Blake drain was placed through the previously placed fourth arm.  We did place a Carter-Thomason 0 vicryl suture through the 12 mm assistant port. The robot was undocked and all the trochars were removed under direct vision.    I enlarged the umbilical trocar site large enough to remove the prostate and closed the fascia with 0 PDS sutures in figure-of-eight interrupted fashion.  All the port sites were irrigated.  Exparel  was injected to the trocar sites.  The skin was closed with 4-0 Monocryl in a running subcuticular fashion.  Skin glue was applied.  At this point, the patient was extubated and awakened in the operating room and taken to recovery room in stable condition.  There were no immediate complications.  All counts were correct.  Plan: Admit for observation overnight.  Clear liquids tonight.  Regular for breakfast tomorrow.  Anticipate discharge home tomorrow.  Steffan Pea MD

## 2024-01-12 ENCOUNTER — Other Ambulatory Visit (HOSPITAL_COMMUNITY): Payer: Self-pay

## 2024-01-12 ENCOUNTER — Encounter (HOSPITAL_COMMUNITY): Payer: Self-pay | Admitting: Urology

## 2024-01-12 DIAGNOSIS — C61 Malignant neoplasm of prostate: Secondary | ICD-10-CM | POA: Diagnosis not present

## 2024-01-12 LAB — CREATININE, FLUID (PLEURAL, PERITONEAL, JP DRAINAGE): Creat, Fluid: 1 mg/dL

## 2024-01-12 LAB — BASIC METABOLIC PANEL WITH GFR
Anion gap: 11 (ref 5–15)
BUN: 11 mg/dL (ref 8–23)
CO2: 22 mmol/L (ref 22–32)
Calcium: 8.2 mg/dL — ABNORMAL LOW (ref 8.9–10.3)
Chloride: 107 mmol/L (ref 98–111)
Creatinine, Ser: 0.85 mg/dL (ref 0.61–1.24)
GFR, Estimated: >60 mL/min (ref >60)
Glucose, Bld: 139 mg/dL — ABNORMAL HIGH (ref 70–99)
Potassium: 4 mmol/L (ref 3.5–5.1)
Sodium: 140 mmol/L (ref 135–145)

## 2024-01-12 LAB — CBC
HCT: 32.7 % — ABNORMAL LOW (ref 39.0–52.0)
Hemoglobin: 11 g/dL — ABNORMAL LOW (ref 13.0–17.0)
MCH: 30.2 pg (ref 26.0–34.0)
MCHC: 33.6 g/dL (ref 30.0–36.0)
MCV: 89.8 fL (ref 80.0–100.0)
Platelets: 233 K/uL (ref 150–400)
RBC: 3.64 MIL/uL — ABNORMAL LOW (ref 4.22–5.81)
RDW: 13.4 % (ref 11.5–15.5)
WBC: 8.8 K/uL (ref 4.0–10.5)
nRBC: 0 % (ref 0.0–0.2)

## 2024-01-12 LAB — GLUCOSE, CAPILLARY
Glucose-Capillary: 120 mg/dL — ABNORMAL HIGH (ref 70–99)
Glucose-Capillary: 128 mg/dL — ABNORMAL HIGH (ref 70–99)
Glucose-Capillary: 131 mg/dL — ABNORMAL HIGH (ref 70–99)

## 2024-01-12 MED ORDER — HYDROCODONE-ACETAMINOPHEN 5-325 MG PO TABS
1.0000 | ORAL_TABLET | Freq: Four times a day (QID) | ORAL | 0 refills | Status: DC | PRN
  Filled 2024-01-12: qty 20, 3d supply, fill #0

## 2024-01-12 MED ORDER — POLYETHYLENE GLYCOL 3350 17 GM/SCOOP PO POWD
17.0000 g | Freq: Every day | ORAL | 0 refills | Status: DC
  Filled 2024-01-12: qty 238, 14d supply, fill #0

## 2024-01-12 MED ORDER — DOCUSATE SODIUM 100 MG PO CAPS: 100.0000 mg | ORAL_CAPSULE | Freq: Two times a day (BID) | ORAL | Status: DC

## 2024-01-12 MED ORDER — SULFAMETHOXAZOLE-TRIMETHOPRIM 800-160 MG PO TABS
1.0000 | ORAL_TABLET | Freq: Two times a day (BID) | ORAL | 0 refills | Status: DC
  Filled 2024-01-12: qty 6, 3d supply, fill #0

## 2024-01-12 MED ORDER — METHOCARBAMOL 500 MG PO TABS
500.0000 mg | ORAL_TABLET | Freq: Three times a day (TID) | ORAL | 0 refills | Status: DC
  Filled 2024-01-12: qty 20, 7d supply, fill #0

## 2024-01-12 MED ORDER — SENNA 8.6 MG PO TABS: 1.0000 | ORAL_TABLET | Freq: Every day | ORAL | 0 refills | Status: AC

## 2024-01-12 NOTE — Progress Notes (Signed)
   01/12/24 1518  TOC Brief Assessment  Insurance and Status Reviewed  Patient has primary care physician Yes  Home environment has been reviewed HOME WITH SPOUSE  Prior level of function: independent  Prior/Current Home Services No current home services  Social Drivers of Health Review SDOH reviewed no interventions necessary  Readmission risk has been reviewed Yes  Transition of care needs no transition of care needs at this time

## 2024-01-12 NOTE — Plan of Care (Signed)
  Problem: Clinical Measurements: Goal: Diagnostic test results will improve Outcome: Progressing   Problem: Clinical Measurements: Goal: Respiratory complications will improve Outcome: Progressing   Problem: Clinical Measurements: Goal: Cardiovascular complication will be avoided Outcome: Progressing   Problem: Coping: Goal: Level of anxiety will decrease Outcome: Progressing

## 2024-01-12 NOTE — Anesthesia Postprocedure Evaluation (Signed)
 Anesthesia Post Note  Patient: Sean [name]  Procedure(s) Performed: ROBOTIC ASSISTED LAPAROSCOPIC RADICAL PROSTATECTOMY (Pelvis) LYMPHADENECTOMY, PELVIS, ROBOT-ASSISTED (Pelvis)     Patient location during evaluation: PACU Anesthesia Type: General Level of consciousness: awake and alert Pain management: pain level controlled Vital Signs Assessment: post-procedure vital signs reviewed and stable Respiratory status: spontaneous breathing, nonlabored ventilation, respiratory function stable and patient connected to nasal cannula oxygen Cardiovascular status: blood pressure returned to baseline and stable Postop Assessment: no apparent nausea or vomiting Anesthetic complications: no   No notable events documented.  Last Vitals:  Vitals:   01/12/24 0325 01/12/24 0749  BP: 123/75 134/75  Pulse: 80 82  Resp: 14 16  Temp: 37.5 C 36.8 C  SpO2: 97% 95%    Last Pain:  Vitals:   01/12/24 0749  TempSrc: Oral  PainSc: 2                  Irma Delancey S

## 2024-01-12 NOTE — Progress Notes (Signed)
 1 Day Post-Op Subjective: Doing well, ambulated, not passing gas, pain well managed, No N/V. Mild burping.   Objective: Vital signs in last 24 hours: Temp:  [97.8 F (36.6 C)-99.5 F (37.5 C)] 99.5 F (37.5 C) (09/03 0325) Pulse Rate:  [79-90] 80 (09/03 0325) Resp:  [9-20] 14 (09/03 0325) BP: (121-169)/(75-105) 123/75 (09/03 0325) SpO2:  [92 %-100 %] 97 % (09/03 0325) Weight:  [98.4 kg] 98.4 kg (09/02 0735)  Intake/Output from previous day: 09/02 0701 - 09/03 0700 In: 3135 [P.O.:100; I.V.:1885; IV Piggyback:1150] Out: 1690 [Urine:1050; Drains:190; Blood:450] Intake/Output this shift: Total I/O In: 535 [P.O.:100; I.V.:385; IV Piggyback:50] Out: 730 [Urine:650; Drains:80]  Physical Exam:  General: Alert and oriented CV: RRR Lungs: Clear Abdomen: Soft, ND, ATTP; inc c/d/I, drain in place draining serosanguinous fluid  GU: draining clear yellow urine   Lab Results: Recent Labs    01/11/24 1500 01/12/24 0358  HGB 13.2 11.0*  HCT 41.4 32.7*   BMET Recent Labs    01/11/24 1500 01/12/24 0358  NA 141 140  K 4.5 4.0  CL 105 107  CO2 24 22  GLUCOSE 187* 139*  BUN 11 11  CREATININE 0.93 0.85  CALCIUM  8.7* 8.2*     Studies/Results: No results found.  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   # s/p RALP - advance to reg diet - fluid creatinine ordered  - continue catheter - continue drain  - due to vascularity and bleeding intra op will hold heparin   - bowel regimen continue  - multimodal pain control   # HTN - hold HTN meds      LOS: 0 days   Jackey Pea MD 01/12/2024, 6:52 AM [facility]

## 2024-01-12 NOTE — Progress Notes (Cosign Needed Addendum)
 Changed patients JP dressing located on right side of abdomen, dressing changed with split gauze and Tegaderm, dated for 9/3 0940 completed by this student nurse. Erminio Moats clinical instructor observed dressing change. Foley care and education given to patient, he verbalized his understanding.   Isaiah Lamer  RN Student

## 2024-01-12 NOTE — Discharge Summary (Signed)
 Date of admission: 01/11/2024  Date of discharge: 01/12/2024  Admission diagnosis: Prostate Ca   Discharge diagnosis: same  Secondary diagnoses:  Patient Active Problem List   Diagnosis Date Noted   Prostate cancer (HCC) 01/11/2024   Hyperglycemia 12/24/2017   Erectile dysfunction 12/23/2017   Postnasal drip 12/23/2017   Chronic right-sided low back pain without sciatica 12/23/2017   Hypertension 12/25/2016   Hyperlipidemia 12/25/2016   Lipoma 12/25/2016   Chronic pain of left knee 12/08/2016    Procedures performed: Procedure(s): ROBOTIC ASSISTED LAPAROSCOPIC RADICAL PROSTATECTOMY LYMPHADENECTOMY, PELVIS, ROBOT-ASSISTED  History and Physical: For full details, please see admission history and physical. Briefly, Sean [name] is a 63 y.o. year old patient with RALP and BPLND.   Hospital Course: Patient tolerated the procedure well.  He was then transferred to the floor after an uneventful PACU stay.  His hospital course was uncomplicated.  On POD#1 he had met discharge criteria: was eating a regular diet, was up and ambulating independently,  pain was well controlled, his catheter was draining clear urine , and was ready to for discharge. HE had fluid creatinine which was serous and drain was removed.    Laboratory values:  Recent Labs    01/11/24 1500 01/12/24 0358  WBC 12.2* 8.8  HGB 13.2 11.0*  HCT 41.4 32.7*   Recent Labs    01/11/24 1500 01/12/24 0358  NA 141 140  K 4.5 4.0  CL 105 107  CO2 24 22  GLUCOSE 187* 139*  BUN 11 11  CREATININE 0.93 0.85  CALCIUM  8.7* 8.2*   No results for input(s): LABPT, INR in the last 72 hours. No results for input(s): LABURIN in the last 72 hours. No results found for this or any previous visit.  Disposition: Home  Discharge instruction: The patient was instructed to be ambulatory but told to refrain from heavy lifting, strenuous activity, or driving.   Discharge medications:  Allergies as of 01/12/2024   No Known  Allergies      Medication List     STOP taking these medications    ibuprofen  200 MG tablet Commonly known as: ADVIL        TAKE these medications    atorvastatin  20 MG tablet Commonly known as: LIPITOR TAKE 1 TABLET BY MOUTH EVERY DAY What changed: when to take this   docusate sodium  100 MG capsule Commonly known as: COLACE Take 1 capsule (100 mg total) by mouth 2 (two) times daily.   HYDROcodone -acetaminophen  5-325 MG tablet Commonly known as: NORCO/VICODIN Take 1-2 tablets by mouth every 6 (six) hours as needed for moderate pain (pain score 4-6) or severe pain (pain score 7-10).   lisinopril  10 MG tablet Commonly known as: ZESTRIL  TAKE 1 TABLET BY MOUTH EVERY DAY What changed: when to take this   methocarbamol  500 MG tablet Commonly known as: ROBAXIN  Take 1 tablet (500 mg total) by mouth 3 (three) times daily.   polyethylene glycol powder 17 GM/SCOOP powder Commonly known as: MiraLax  Take 17 g by mouth daily. Dissolve 1 capful (17g) in 4-8 ounces of liquid and take by mouth daily.   senna 8.6 MG Tabs tablet Commonly known as: SENOKOT Take 1 tablet (8.6 mg total) by mouth daily for 30 doses.   sildenafil  20 MG tablet Commonly known as: REVATIO  TAKE 1-5 TABLETS (20-100 MG TOTAL) BY MOUTH DAILY AS NEEDED (ERECTILE DYSFUNCTION). What changed: how much to take   sulfamethoxazole -trimethoprim  800-160 MG tablet Commonly known as: BACTRIM  DS Take 1 tablet by mouth 2 (  two) times daily. Start the day prior to foley removal appointment        Followup:   Follow-up Information     Claudene Waddell HERO, PA-C Follow up on 01/20/2024.   Why: at 9:45 Contact information: [address] [place] [place] [address] [phone]

## 2024-01-12 NOTE — Progress Notes (Signed)
 I have reviewed and concur with this student's documentation.   Erminio LITTIE Moats, RN 01/12/2024 11:57 AM

## 2024-01-13 ENCOUNTER — Other Ambulatory Visit (HOSPITAL_COMMUNITY): Payer: Self-pay

## 2024-01-17 LAB — SURGICAL PATHOLOGY

## 2024-03-05 ENCOUNTER — Other Ambulatory Visit: Payer: Self-pay | Admitting: Family Medicine

## 2024-05-09 ENCOUNTER — Encounter: Admitting: Family Medicine

## 2024-05-18 ENCOUNTER — Encounter: Payer: Self-pay | Admitting: Family Medicine

## 2024-05-18 ENCOUNTER — Ambulatory Visit: Admitting: Family Medicine

## 2024-05-18 VITALS — BP 134/88 | HR 77 | Temp 97.1°F | Ht 74.0 in | Wt 221.4 lb

## 2024-05-18 DIAGNOSIS — I10 Essential (primary) hypertension: Secondary | ICD-10-CM | POA: Diagnosis not present

## 2024-05-18 DIAGNOSIS — E785 Hyperlipidemia, unspecified: Secondary | ICD-10-CM | POA: Diagnosis not present

## 2024-05-18 DIAGNOSIS — C61 Malignant neoplasm of prostate: Secondary | ICD-10-CM

## 2024-05-18 DIAGNOSIS — Z0001 Encounter for general adult medical examination with abnormal findings: Secondary | ICD-10-CM | POA: Diagnosis not present

## 2024-05-18 DIAGNOSIS — R739 Hyperglycemia, unspecified: Secondary | ICD-10-CM | POA: Diagnosis not present

## 2024-05-18 DIAGNOSIS — Z125 Encounter for screening for malignant neoplasm of prostate: Secondary | ICD-10-CM | POA: Diagnosis not present

## 2024-05-18 DIAGNOSIS — Z23 Encounter for immunization: Secondary | ICD-10-CM

## 2024-05-18 LAB — COMPREHENSIVE METABOLIC PANEL WITH GFR
ALT: 17 U/L (ref 3–53)
AST: 15 U/L (ref 5–37)
Albumin: 4.2 g/dL (ref 3.5–5.2)
Alkaline Phosphatase: 44 U/L (ref 39–117)
BUN: 20 mg/dL (ref 6–23)
CO2: 28 meq/L (ref 19–32)
Calcium: 9.4 mg/dL (ref 8.4–10.5)
Chloride: 106 meq/L (ref 96–112)
Creatinine, Ser: 0.8 mg/dL (ref 0.40–1.50)
GFR: 94.03 mL/min
Glucose, Bld: 99 mg/dL (ref 70–99)
Potassium: 4.5 meq/L (ref 3.5–5.1)
Sodium: 140 meq/L (ref 135–145)
Total Bilirubin: 0.4 mg/dL (ref 0.2–1.2)
Total Protein: 7.2 g/dL (ref 6.0–8.3)

## 2024-05-18 LAB — CBC
HCT: 40.9 % (ref 39.0–52.0)
Hemoglobin: 13.5 g/dL (ref 13.0–17.0)
MCHC: 33.1 g/dL (ref 30.0–36.0)
MCV: 85.8 fl (ref 78.0–100.0)
Platelets: 335 K/uL (ref 150.0–400.0)
RBC: 4.77 Mil/uL (ref 4.22–5.81)
RDW: 14.2 % (ref 11.5–15.5)
WBC: 4.9 K/uL (ref 4.0–10.5)

## 2024-05-18 LAB — TSH: TSH: 0.38 u[IU]/mL (ref 0.35–5.50)

## 2024-05-18 LAB — HEMOGLOBIN A1C: Hgb A1c MFr Bld: 6.3 % (ref 4.6–6.5)

## 2024-05-18 LAB — LIPID PANEL
Cholesterol: 160 mg/dL (ref 28–200)
HDL: 40 mg/dL
LDL Cholesterol: 90 mg/dL (ref 10–99)
NonHDL: 119.81
Total CHOL/HDL Ratio: 4
Triglycerides: 151 mg/dL — ABNORMAL HIGH (ref 10.0–149.0)
VLDL: 30.2 mg/dL (ref 0.0–40.0)

## 2024-05-18 MED ORDER — LISINOPRIL 10 MG PO TABS: 10.0000 mg | ORAL_TABLET | Freq: Every day | ORAL | 3 refills | Status: AC

## 2024-05-18 MED ORDER — ATORVASTATIN CALCIUM 20 MG PO TABS: 20.0000 mg | ORAL_TABLET | Freq: Every day | ORAL | 3 refills | Status: AC

## 2024-05-18 NOTE — Assessment & Plan Note (Signed)
 Blood pressure at goal today on lisinopril  10 mg daily.  Check labs.

## 2024-05-18 NOTE — Assessment & Plan Note (Signed)
 On Lipitor 20 mg daily.  Tolerating well.  Check labs.

## 2024-05-18 NOTE — Patient Instructions (Signed)
 It was very nice to see you today!     Return in about 1 year (around 05/18/2025) for Annual Physical.   Take care, Dr Kennyth  PLEASE NOTE:  If you had any lab tests, please let us  know if you have not heard back within a few days. You may see your results on mychart before we have a chance to review them but we will give you a call once they are reviewed by us .   If we ordered any referrals today, please let us  know if you have not heard from their office within the next week.   If you had any urgent prescriptions sent in today, please check with the pharmacy within an hour of our visit to make sure the prescription was transmitted appropriately.   Please try these tips to maintain a healthy lifestyle:  Eat at least 3 REAL meals and 1-2 snacks per day.  Aim for no more than 5 hours between eating.  If you eat breakfast, please do so within one hour of getting up.   Each meal should contain half fruits/vegetables, one quarter protein, and one quarter carbs (no bigger than a computer mouse)  Cut down on sweet beverages. This includes juice, soda, and sweet tea.   Drink at least 1 glass of water with each meal and aim for at least 8 glasses per day  Exercise at least 150 minutes every week.

## 2024-05-18 NOTE — Assessment & Plan Note (Signed)
 Check A1c with labs.  Discussed lifestyle modifications.

## 2024-05-18 NOTE — Assessment & Plan Note (Signed)
 Patient diagnosed with prostate cancer since our last visit and underwent prostatectomy a few months ago.  He is recovering without complication and most recent PSA levels were undetectable.  He will follow-up with urology as previously planned.

## 2024-05-18 NOTE — Progress Notes (Signed)
 "  Chief Complaint:  Sean [name] is a 64 y.o. male who presents today for his annual comprehensive physical exam.    Assessment/Plan:  Chronic Problems Addressed Today: Hypertension Blood pressure at goal today on lisinopril  10 mg daily.  Check labs.  Hyperlipidemia On Lipitor 20 mg daily.  Tolerating well.  Check labs.  Hyperglycemia Check A1c with labs.  Discussed lifestyle modifications.  Prostate cancer [facility]) s/p prostatectomy 2025 Patient diagnosed with prostate cancer since our last visit and underwent prostatectomy a few months ago.  He is recovering without complication and most recent PSA levels were undetectable.  He will follow-up with urology as previously planned.  Preventative Healthcare: Check labs.  Pneumonia vaccine given today.  Up-to-date on other vaccines and screenings.  Patient Counseling(The following topics were reviewed and/or handout was given):  -Nutrition: Stressed importance of moderation in sodium/caffeine intake, saturated fat and cholesterol, caloric balance, sufficient intake of fresh fruits, vegetables, and fiber.  -Stressed the importance of regular exercise.   -Substance Abuse: Discussed cessation/primary prevention of tobacco, alcohol, or other drug use; driving or other dangerous activities under the influence; availability of treatment for abuse.   -Injury prevention: Discussed safety belts, safety helmets, smoke detector, smoking near bedding or upholstery.   -Sexuality: Discussed sexually transmitted diseases, partner selection, use of condoms, avoidance of unintended pregnancy and contraceptive alternatives.   -Dental health: Discussed importance of regular tooth brushing, flossing, and dental visits.  -Health maintenance and immunizations reviewed. Please refer to Health maintenance section.  Return to care in 1 year for next preventative visit.     Subjective:  HPI:  He has no acute complaints today. Patient is here today for his annual  physical.  See assessment / plan for status of chronic conditions.  Discussed the use of AI scribe software for clinical note transcription with the patient, who gave verbal consent to proceed.  History of Present Illness Sean [name] is a 64 year old male who presents for a follow-up visit after prostatectomy.  He underwent prostatectomy following an elevated PSA level noted over a year ago. Recovery has been gradual but is improving, although he still experiences some aftereffects. His recent PSA test showed undetectable levels.  He is currently taking atorvastatin  and lisinopril , with about half a bottle remaining of each medication. He has not experienced any issues with these medications and his blood pressure is reportedly good.  He is working on improving his diet and exercise routine, which had lapsed due to various circumstances. He is engaging in physical therapy exercises for incontinence and gradually increasing his physical activity, including using an elliptical for cardio and weights for resistance training. He is drinking more water and consuming more fruits and vegetables.  He recently experienced the flu over New Year's Eve, which resulted in a mild fever and general malaise. He had received the flu vaccine, which he believes helped mitigate the severity of his symptoms.  His social history includes a recent trip to [place]  for Christmas to visit his wife's sister, who is in hospice care. He also mentioned a past adverse reaction to the shingles vaccine, which caused significant side effects for two days.       05/18/2024   10:28 AM  Depression screen PHQ 2/9  Decreased Interest 0  Down, Depressed, Hopeless 0  PHQ - 2 Score 0    Health Maintenance Due  Topic Date Due   Pneumococcal Vaccine: 50+ Years (1 of 1 - PCV) Never done  ROS: Per HPI, otherwise a complete review of systems was negative.   PMH:  The following were reviewed and entered/updated in  epic: Past Medical History:  Diagnosis Date   Allergy    seasonal   Cancer (HCC)    Hyperlipidemia    Hypertension    Patient Active Problem List   Diagnosis Date Noted   Prostate cancer [facility]) s/p prostatectomy 2025 01/11/2024   Hyperglycemia 12/24/2017   Erectile dysfunction 12/23/2017   Postnasal drip 12/23/2017   Chronic right-sided low back pain without sciatica 12/23/2017   Hypertension 12/25/2016   Hyperlipidemia 12/25/2016   Lipoma 12/25/2016   Chronic pain of left knee 12/08/2016   Past Surgical History:  Procedure Laterality Date   INSERTION OF MESH N/A 03/17/2018   Procedure: INSERTION OF MESH;  Surgeon: Vanderbilt Ned, MD;  Location:  SURGERY CENTER;  Service: General;  Laterality: N/A;   KNEE SURGERY     left knee arthroscopy   ROBOT ASSISTED LAPAROSCOPIC RADICAL PROSTATECTOMY N/A 01/11/2024   Procedure: ROBOTIC ASSISTED LAPAROSCOPIC RADICAL PROSTATECTOMY;  Surgeon: Shane Steffan BROCKS, MD;  Location: [facility];  Service: Urology;  Laterality: N/A;   VENTRAL HERNIA REPAIR N/A 03/17/2018   Procedure: REPAIR VENTRAL HERNIA WITH MESH;  Surgeon: Vanderbilt Ned, MD;  Location:  SURGERY CENTER;  Service: General;  Laterality: N/A;    Family History  Problem Relation Age of Onset   Prostate cancer Father    Prostate cancer Paternal Uncle        diagnosed in 2007   Colon cancer Neg Hx    Colon polyps Neg Hx    Esophageal cancer Neg Hx    Stomach cancer Neg Hx    Rectal cancer Neg Hx    Medications- reviewed and updated Current Outpatient Medications  Medication Sig Dispense Refill   atorvastatin  (LIPITOR) 20 MG tablet Take 1 tablet (20 mg total) by mouth daily. 90 tablet 3   lisinopril  (ZESTRIL ) 10 MG tablet Take 1 tablet (10 mg total) by mouth daily. 90 tablet 3   No current facility-administered medications for this visit.    Allergies-reviewed and updated Allergies[1]  Social History   Socioeconomic History   Marital status: Married     Spouse name: Not on file   Number of children: 3   Years of education: Not on file   Highest education level: Not on file  Occupational History   Not on file  Tobacco Use   Smoking status: Never   Smokeless tobacco: Never  Vaping Use   Vaping status: Never Used  Substance and Sexual Activity   Alcohol use: Yes    Comment: Drinks rarely   Drug use: No   Sexual activity: Not on file  Other Topics Concern   Not on file  Social History Narrative   Not on file   Social Drivers of Health   Tobacco Use: Low Risk (05/18/2024)   Patient History    Smoking Tobacco Use: Never    Smokeless Tobacco Use: Never    Passive Exposure: Not on file  Financial Resource Strain: Not on file  Food Insecurity: No Food Insecurity (01/11/2024)   Epic    Worried About Programme Researcher, Broadcasting/film/video in the Last Year: Never true    Ran Out of Food in the Last Year: Never true  Transportation Needs: No Transportation Needs (01/11/2024)   Epic    Lack of Transportation (Medical): No    Lack of Transportation (Non-Medical): No  Physical Activity: Not on file  Stress: Not on file  Social Connections: Not on file  Depression (PHQ2-9): Low Risk (05/18/2024)   Depression (PHQ2-9)    PHQ-2 Score: 0  Alcohol Screen: Not on file  Housing: Low Risk (01/11/2024)   Epic    Unable to Pay for Housing in the Last Year: No    Number of Times Moved in the Last Year: 0    Homeless in the Last Year: No  Utilities: Not At Risk (01/11/2024)   Epic    Threatened with loss of utilities: No  Health Literacy: Not on file        Objective:  Physical Exam: BP 134/88   Pulse 77   Temp (!) 97.1 F (36.2 C) (Temporal)   Ht 6' 2 (1.88 m)   Wt 221 lb 6.4 oz (100.4 kg)   SpO2 96%   BMI 28.43 kg/m   Body mass index is 28.43 kg/m. Wt Readings from Last 3 Encounters:  05/18/24 221 lb 6.4 oz (100.4 kg)  01/11/24 217 lb (98.4 kg)  12/31/23 218 lb (98.9 kg)   Gen: NAD, resting comfortably HEENT: TMs normal bilaterally. OP clear. No  thyromegaly noted.  CV: RRR with no murmurs appreciated Pulm: NWOB, CTAB with no crackles, wheezes, or rhonchi GI: Normal bowel sounds present. Soft, Nontender, Nondistended. MSK: no edema, cyanosis, or clubbing noted Skin: warm, dry Neuro: CN2-12 grossly intact. Strength 5/5 in upper and lower extremities. Reflexes symmetric and intact bilaterally.  Psych: Normal affect and thought content     Chantil Bari M. Kennyth, MD 05/18/2024 10:50 AM     [1] No Known Allergies  "

## 2024-05-18 NOTE — Addendum Note (Signed)
 Addended by: IDA ELORA HERO on: 05/18/2024 11:02 AM   Modules accepted: Orders

## 2024-05-19 ENCOUNTER — Ambulatory Visit: Payer: Self-pay | Admitting: Family Medicine

## 2024-05-19 NOTE — Progress Notes (Signed)
 His A1c is mildly elevated at 6.3.  This is higher than it has been for the last few years.  Do not need to start meds for this however he should continue to work on diet and exercise and we can recheck again in a year or so.  All of his other labs are at goal.  Do not need to make any other changes to his treatment plan at this time.  He should continue to work on diet and exercise and we can recheck everything in a year or so.

## 2025-05-21 ENCOUNTER — Encounter: Admitting: Family Medicine
